# Patient Record
Sex: Male | Born: 1975
Health system: Southern US, Community
[De-identification: ages and names within clinical notes are randomized; demographics above are authoritative.]

## PROBLEM LIST (undated history)

## (undated) DIAGNOSIS — I10 Essential (primary) hypertension: Secondary | ICD-10-CM

## (undated) DIAGNOSIS — R011 Cardiac murmur, unspecified: Secondary | ICD-10-CM

---

## 1999-07-07 ENCOUNTER — Emergency Department (HOSPITAL_COMMUNITY): Admission: EM | Admit: 1999-07-07 | Discharge: 1999-07-07 | Payer: Self-pay | Admitting: Emergency Medicine

## 2002-06-01 ENCOUNTER — Emergency Department (HOSPITAL_COMMUNITY): Admission: EM | Admit: 2002-06-01 | Discharge: 2002-06-01 | Payer: Self-pay | Admitting: Internal Medicine

## 2003-03-11 ENCOUNTER — Emergency Department (HOSPITAL_COMMUNITY): Admission: EM | Admit: 2003-03-11 | Discharge: 2003-03-11 | Payer: Self-pay | Admitting: Emergency Medicine

## 2004-11-10 ENCOUNTER — Ambulatory Visit: Payer: Self-pay | Admitting: Family Medicine

## 2004-11-11 ENCOUNTER — Ambulatory Visit (HOSPITAL_COMMUNITY): Admission: RE | Admit: 2004-11-11 | Discharge: 2004-11-11 | Payer: Self-pay | Admitting: Family Medicine

## 2005-10-25 ENCOUNTER — Emergency Department (HOSPITAL_COMMUNITY): Admission: EM | Admit: 2005-10-25 | Discharge: 2005-10-25 | Payer: Self-pay | Admitting: Emergency Medicine

## 2007-04-12 ENCOUNTER — Emergency Department (HOSPITAL_COMMUNITY): Admission: EM | Admit: 2007-04-12 | Discharge: 2007-04-12 | Payer: Self-pay | Admitting: Emergency Medicine

## 2007-06-23 ENCOUNTER — Encounter: Payer: Self-pay | Admitting: Family Medicine

## 2008-07-30 ENCOUNTER — Emergency Department (HOSPITAL_COMMUNITY): Admission: EM | Admit: 2008-07-30 | Discharge: 2008-07-30 | Payer: Self-pay | Admitting: Emergency Medicine

## 2009-08-25 ENCOUNTER — Emergency Department (HOSPITAL_COMMUNITY): Admission: EM | Admit: 2009-08-25 | Discharge: 2009-08-25 | Payer: Self-pay | Admitting: Emergency Medicine

## 2010-06-09 ENCOUNTER — Emergency Department (HOSPITAL_COMMUNITY)
Admission: EM | Admit: 2010-06-09 | Discharge: 2010-06-09 | Payer: Self-pay | Source: Home / Self Care | Admitting: Emergency Medicine

## 2010-07-22 NOTE — Letter (Signed)
Summary: Historic Patient File  Historic Patient File   Imported By: Lind Guest 05/13/2010 11:14:14  _____________________________________________________________________  External Attachment:    Type:   Image     Comment:   External Document

## 2010-11-07 NOTE — Consult Note (Signed)
   NAMEKRIKOR, WILLET NO.:  [id number]   MEDICAL RECORD NO.:  [id number]                   PATIENT TYPE:  EMS   LOCATION:  ED                                   FACILITY:  [facility]   PHYSICIAN:  Barbaraann Barthel, M.D.              DATE OF BIRTH:  [birth year]   DATE OF CONSULTATION:  03/11/2003  DATE OF DISCHARGE:                                   CONSULTATION   Surgery was asked to see this 35 year old black male who presented with an  abscess on the inner aspect of his right thigh.  He is a nondiabetic.  Has  no known allergies.  He states that he has had this problem before and has  had spontaneously erupting abscesses from this area.   CLINICAL EXAMINATION:  GENERAL:  He is obese.  However, he does not have  hydradenitis suppurativa.  He does have a large formed abscess on the inner  aspect of his right thigh.  No other complaints.  VITAL SIGNS:  Temperature 98.2, blood pressure 126/74, pulse rate 93,  respirations 20.   TREATMENT RENDERED:  The area was prepped with Betadine solution and draped  in the usual manner.  Using approximately 15 mL of 1% Xylocaine without  epinephrine, the area was anesthetized.  The abscess was then opened along  its complete area of fluctuance.  This was then irrigated and packed with  normal saline-soaked 4x4 with a sterile dressing with ABD and Kerlix.  The  patient will be discharged on Cipro 500 mg p.o. b.i.d., and he was given  Darvocet-N 100 1-2 tablets p.o. q.4h. p.r.n. pain.  Arrangements are made to  follow up with him in 24 hours in my office.  He is told to go to the  emergency room should there be any acute changes in the interim.  Cultures  were taken for sensitivities.  These will be sent to my office as well.                                               Barbaraann Barthel, M.D.    WB/MEDQ  D:  03/11/2003  T:  03/12/2003  Job:  [id number]

## 2011-03-03 ENCOUNTER — Emergency Department (HOSPITAL_COMMUNITY)
Admission: EM | Admit: 2011-03-03 | Discharge: 2011-03-03 | Disposition: A | Discharge status: Home / Self Care | Payer: Self-pay | Attending: Emergency Medicine | Admitting: Emergency Medicine

## 2011-03-03 ENCOUNTER — Encounter: Payer: Self-pay | Admitting: Emergency Medicine

## 2011-03-03 DIAGNOSIS — F17200 Nicotine dependence, unspecified, uncomplicated: Secondary | ICD-10-CM | POA: Insufficient documentation

## 2011-03-03 DIAGNOSIS — I10 Essential (primary) hypertension: Secondary | ICD-10-CM | POA: Insufficient documentation

## 2011-03-03 DIAGNOSIS — S90569A Insect bite (nonvenomous), unspecified ankle, initial encounter: Secondary | ICD-10-CM | POA: Insufficient documentation

## 2011-03-03 DIAGNOSIS — W57XXXA Bitten or stung by nonvenomous insect and other nonvenomous arthropods, initial encounter: Secondary | ICD-10-CM | POA: Insufficient documentation

## 2011-03-03 HISTORY — DX: Cardiac murmur, unspecified: R01.1

## 2011-03-03 HISTORY — DX: Essential (primary) hypertension: I10

## 2011-03-03 MED ORDER — MUPIROCIN CALCIUM 2 % EX CREA: TOPICAL_CREAM | Freq: Three times a day (TID) | CUTANEOUS | Status: AC

## 2011-03-03 NOTE — ED Provider Notes (Signed)
History     CSN: [id number] Arrival date & time: 03/03/2011  8:10 AM  Chief Complaint  Patient presents with  . Abscess   HPI Travis Beltran is a 35 y.o. male who presents to the ED for insect bite to his left knee.  Onset over a week ago. Patient concerned that it may have been a spider. He has been scratching because it itches. No taking any medication for itching and not using anything on the bite. Patient denies any other problems. The history was provided by the patient.  Past Medical History  Diagnosis Date  . Hypertension   . Heart murmur     History reviewed. No pertinent past surgical history.  Family History  Problem Relation Age of Onset  . Cancer Father   . Hypertension Father   . Hypertension Mother     History  Substance Use Topics  . Smoking status: Current Everyday Smoker -- 0.5 packs/day  . Smokeless tobacco: Not on file  . Alcohol Use: No      Review of Systems  Skin:       Insect bite  All other systems reviewed and are negative.    Physical Exam  BP 167/92  Pulse 68  Temp 98.3 F (36.8 C)  Resp 20  Ht 6\' 1"  (1.854 m)  Wt 343 lb (155.584 kg)  BMI 45.25 kg/m2  SpO2 100%  Physical Exam  Nursing note and vitals reviewed. Constitutional: He is oriented to person, place, and time. He appears well-developed and well-nourished.  HENT:  Head: Normocephalic and atraumatic.  Eyes: EOM are normal.  Neck: Neck supple.  Pulmonary/Chest: Effort normal. No respiratory distress. He has no wheezes.  Abdominal: There is no tenderness.  Musculoskeletal: Normal range of motion. He exhibits no edema.  Neurological: He is alert and oriented to person, place, and time. No cranial nerve deficit.  Skin: Skin is warm and dry.       Tiny area of redness with ? Insect bite.   Psychiatric: He has a normal mood and affect.    ED Course  Procedures Patient recently started on HCTZ 25 mg. Daily for hypertension  Assessment:  Insect  bite   Hypertension  Plan:  Benadryl OTC   Bactroban cream   Follow up with PCP for blood pressure      Kerrie Buffalo, NP 03/04/11 1202

## 2011-03-03 NOTE — ED Notes (Signed)
Pt c/o "spider bite" to left knee x 16 days

## 2011-03-03 NOTE — ED Notes (Addendum)
C/O "spider bite" to left kneecap area 16 days ago.  Did not see a spider but, states he "felt it."  Very small white-like dot along mid kneecap region--no swelling, no drainage.  He reports significant pain at site and rates pain "4" on 1-10 scale.  Did not seek trt. Prior to today.

## 2011-03-03 NOTE — ED Notes (Signed)
B/P retaken in trt area  142/85---He advises he hasn't taken his B/P RX yet today--usually takes it around noon daily.

## 2011-03-04 NOTE — ED Provider Notes (Signed)
History/physical exam/procedure(s) were performed by non-physician practitioner and as supervising physician I was immediately available for consultation/collaboration. I have reviewed all notes and am in agreement with care and plan.   Hilario Quarry, MD 03/04/11 2014

## 2011-07-10 ENCOUNTER — Emergency Department (HOSPITAL_COMMUNITY)
Admission: EM | Admit: 2011-07-10 | Discharge: 2011-07-10 | Disposition: A | Discharge status: Home / Self Care | Payer: Self-pay | Attending: Emergency Medicine | Admitting: Emergency Medicine

## 2011-07-10 ENCOUNTER — Emergency Department (HOSPITAL_COMMUNITY): Payer: Self-pay

## 2011-07-10 DIAGNOSIS — I10 Essential (primary) hypertension: Secondary | ICD-10-CM | POA: Insufficient documentation

## 2011-07-10 DIAGNOSIS — M5380 Other specified dorsopathies, site unspecified: Secondary | ICD-10-CM | POA: Insufficient documentation

## 2011-07-10 DIAGNOSIS — M62830 Muscle spasm of back: Secondary | ICD-10-CM

## 2011-07-10 DIAGNOSIS — Z79899 Other long term (current) drug therapy: Secondary | ICD-10-CM | POA: Insufficient documentation

## 2011-07-10 DIAGNOSIS — J069 Acute upper respiratory infection, unspecified: Secondary | ICD-10-CM | POA: Insufficient documentation

## 2011-07-10 DIAGNOSIS — R059 Cough, unspecified: Secondary | ICD-10-CM | POA: Insufficient documentation

## 2011-07-10 DIAGNOSIS — R079 Chest pain, unspecified: Secondary | ICD-10-CM | POA: Insufficient documentation

## 2011-07-10 DIAGNOSIS — R05 Cough: Secondary | ICD-10-CM | POA: Insufficient documentation

## 2011-07-10 DIAGNOSIS — J3489 Other specified disorders of nose and nasal sinuses: Secondary | ICD-10-CM | POA: Insufficient documentation

## 2011-07-10 DIAGNOSIS — IMO0001 Reserved for inherently not codable concepts without codable children: Secondary | ICD-10-CM | POA: Insufficient documentation

## 2011-07-10 MED ORDER — CYCLOBENZAPRINE HCL 5 MG PO TABS: 5.0000 mg | ORAL_TABLET | Freq: Three times a day (TID) | ORAL | Status: AC | PRN

## 2011-07-10 NOTE — ED Notes (Signed)
Pt a/ox4. Resp even and unlabored. NAD at this time. D/C instructions reviewed with pt. Pt verbalized understanding. Pt ambulated to lobby with steady gate.  

## 2011-07-10 NOTE — ED Provider Notes (Signed)
History     CSN: [id number]  Arrival date & time 07/10/11  1636   First MD Initiated Contact with Patient 07/10/11 1655      Chief Complaint  Patient presents with  . Cough  . Nasal Congestion  . Chest Pain    (Consider location/radiation/quality/duration/timing/severity/associated sxs/prior treatment) Patient is a 36 y.o. male presenting with cough and chest pain. The history is provided by the patient.  Cough Episode onset: One week ago. The problem has not changed since onset.The cough is non-productive. There has been no fever. Associated symptoms include myalgias. Pertinent negatives include no chest pain, no headaches, no sore throat, no shortness of breath and no wheezing. Associated symptoms comments: He has complaints of intermittent upper back muscle spasms which she believes is related to his frequent coughing episodes. He denies chest pain and denies shortness of breath.. He has tried cough syrup for the symptoms. The treatment provided moderate relief. He is a smoker. His past medical history does not include pneumonia or asthma.  Chest Pain Primary symptoms include cough. Pertinent negatives for primary symptoms include no fever, no shortness of breath, no wheezing, no abdominal pain, no nausea and no dizziness.  Pertinent negatives for associated symptoms include no numbness and no weakness.     Past Medical History  Diagnosis Date  . Hypertension   . Heart murmur     No past surgical history on file.  Family History  Problem Relation Age of Onset  . Cancer Father   . Hypertension Father   . Hypertension Mother     History  Substance Use Topics  . Smoking status: Current Everyday Smoker -- 0.5 packs/day  . Smokeless tobacco: Not on file  . Alcohol Use: No      Review of Systems  Constitutional: Negative for fever.  HENT: Negative for congestion, sore throat and neck pain.   Eyes: Negative.   Respiratory: Positive for cough. Negative for chest  tightness, shortness of breath and wheezing.   Cardiovascular: Negative for chest pain.  Gastrointestinal: Negative for nausea and abdominal pain.  Genitourinary: Negative.   Musculoskeletal: Positive for myalgias. Negative for joint swelling and arthralgias.  Skin: Negative.  Negative for rash and wound.  Neurological: Negative for dizziness, weakness, light-headedness, numbness and headaches.  Hematological: Negative.   Psychiatric/Behavioral: Negative.     Allergies  Review of patient's allergies indicates no known allergies.  Home Medications   Current Outpatient Rx  Name Route Sig Dispense Refill  . HYDROCHLOROTHIAZIDE 25 MG PO TABS Oral Take 25 mg by mouth daily.     . IBUPROFEN 200 MG PO TABS Oral Take 400 mg by mouth once as needed. For aches/pain    . CYCLOBENZAPRINE HCL 5 MG PO TABS Oral Take 1 tablet (5 mg total) by mouth 3 (three) times daily as needed for muscle spasms. 20 tablet 0    BP 136/80  Pulse 88  Temp(Src) 98 F (36.7 C) (Oral)  Resp 18  Ht 6\' 1"  (1.854 m)  Wt 340 lb (154.223 kg)  BMI 44.86 kg/m2  SpO2 98%  Physical Exam  Nursing note and vitals reviewed. Constitutional: He is oriented to person, place, and time. He appears well-developed and well-nourished.  HENT:  Head: Normocephalic and atraumatic.  Eyes: Conjunctivae are normal.  Neck: Normal range of motion.  Cardiovascular: Normal rate, regular rhythm, normal heart sounds and intact distal pulses.   Pulmonary/Chest: Effort normal and breath sounds normal. He has no wheezes.  Abdominal: Soft. Bowel sounds  are normal. There is no tenderness.  Musculoskeletal: Normal range of motion.  Neurological: He is alert and oriented to person, place, and time.  Skin: Skin is warm and dry.  Psychiatric: He has a normal mood and affect.    ED Course  Procedures (including critical care time)  Labs Reviewed - No data to display Dg Chest 2 View  07/10/2011  *RADIOLOGY REPORT*  Clinical Data: Cough and  congestion  CHEST - 2 VIEW  Comparison: 04/12/2007  Findings: Upper normal heart size is stable.  Thoracic aorta and hilar contours are normal and stable.  The trachea is midline. Normal pulmonary vascularity.  The lungs are clear.  Negative for pleural effusion or pneumothorax.  No acute bony abnormality.  IMPRESSION: No acute cardiopulmonary disease.  Original Report Authenticated By: Britta Mccreedy, M.D.     1. URI (upper respiratory infection)   2. Muscle spasm of back       MDM          Candis Musa, PA 07/10/11 2304

## 2011-07-10 NOTE — ED Notes (Signed)
C/o cough/chest congestion x1 week and generalized chest pain as well. Denies n/v or sob.

## 2011-07-11 NOTE — ED Provider Notes (Signed)
Medical screening examination/treatment/procedure(s) were performed by non-physician practitioner and as supervising physician I was immediately available for consultation/collaboration.   Almeda Ezra L Fawnda Vitullo, MD 07/11/11 0003 

## 2011-09-07 ENCOUNTER — Encounter (HOSPITAL_COMMUNITY): Payer: Self-pay

## 2011-09-07 ENCOUNTER — Emergency Department (HOSPITAL_COMMUNITY)
Admission: EM | Admit: 2011-09-07 | Discharge: 2011-09-08 | Disposition: A | Discharge status: Home / Self Care | Payer: Self-pay | Attending: Emergency Medicine | Admitting: Emergency Medicine

## 2011-09-07 DIAGNOSIS — Z0389 Encounter for observation for other suspected diseases and conditions ruled out: Secondary | ICD-10-CM | POA: Insufficient documentation

## 2011-09-07 NOTE — ED Notes (Signed)
complians of back pain off and on for three weeks, sts may be realted to work.

## 2011-09-07 NOTE — ED Notes (Signed)
Unable to locate pt  

## 2011-09-07 NOTE — ED Notes (Signed)
No answer when called 

## 2011-12-21 ENCOUNTER — Emergency Department (HOSPITAL_COMMUNITY): Payer: Self-pay

## 2011-12-21 ENCOUNTER — Emergency Department (HOSPITAL_COMMUNITY)
Admission: EM | Admit: 2011-12-21 | Discharge: 2011-12-21 | Disposition: A | Discharge status: Home / Self Care | Payer: Self-pay | Attending: Emergency Medicine | Admitting: Emergency Medicine

## 2011-12-21 ENCOUNTER — Encounter (HOSPITAL_COMMUNITY): Payer: Self-pay | Admitting: *Deleted

## 2011-12-21 DIAGNOSIS — M7989 Other specified soft tissue disorders: Secondary | ICD-10-CM | POA: Insufficient documentation

## 2011-12-21 DIAGNOSIS — R600 Localized edema: Secondary | ICD-10-CM

## 2011-12-21 DIAGNOSIS — I10 Essential (primary) hypertension: Secondary | ICD-10-CM | POA: Insufficient documentation

## 2011-12-21 DIAGNOSIS — Z79899 Other long term (current) drug therapy: Secondary | ICD-10-CM | POA: Insufficient documentation

## 2011-12-21 DIAGNOSIS — R609 Edema, unspecified: Secondary | ICD-10-CM | POA: Insufficient documentation

## 2011-12-21 DIAGNOSIS — F17200 Nicotine dependence, unspecified, uncomplicated: Secondary | ICD-10-CM | POA: Insufficient documentation

## 2011-12-21 LAB — COMPREHENSIVE METABOLIC PANEL
ALT: 19 U/L (ref 0–53)
AST: 21 U/L (ref 0–37)
Albumin: 4 g/dL (ref 3.5–5.2)
Alkaline Phosphatase: 103 U/L (ref 39–117)
BUN: 11 mg/dL (ref 6–23)
CO2: 27 mEq/L (ref 19–32)
Calcium: 10.2 mg/dL (ref 8.4–10.5)
Chloride: 99 mEq/L (ref 96–112)
Creatinine, Ser: 0.94 mg/dL (ref 0.50–1.35)
GFR calc Af Amer: >90 mL/min (ref >90)
GFR calc non Af Amer: >90 mL/min (ref >90)
Glucose, Bld: 95 mg/dL (ref 70–99)
Potassium: 4.1 mEq/L (ref 3.5–5.1)
Sodium: 137 mEq/L (ref 135–145)
Total Bilirubin: 0.3 mg/dL (ref 0.3–1.2)
Total Protein: 7.4 g/dL (ref 6.0–8.3)

## 2011-12-21 LAB — CBC WITH DIFFERENTIAL/PLATELET
Basophils Absolute: 0.1 10*3/uL (ref 0.0–0.1)
Basophils Relative: 1 % (ref 0–1)
Eosinophils Absolute: 0.4 10*3/uL (ref 0.0–0.7)
Eosinophils Relative: 5 % (ref 0–5)
HCT: 49.9 % (ref 39.0–52.0)
Hemoglobin: 17.1 g/dL — ABNORMAL HIGH (ref 13.0–17.0)
Lymphocytes Relative: 30 % (ref 12–46)
Lymphs Abs: 2.8 10*3/uL (ref 0.7–4.0)
MCH: 29.6 pg (ref 26.0–34.0)
MCHC: 34.3 g/dL (ref 30.0–36.0)
MCV: 86.3 fL (ref 78.0–100.0)
Monocytes Absolute: 0.6 10*3/uL (ref 0.1–1.0)
Monocytes Relative: 6 % (ref 3–12)
Neutro Abs: 5.2 10*3/uL (ref 1.7–7.7)
Neutrophils Relative %: 58 % (ref 43–77)
Platelets: 243 10*3/uL (ref 150–400)
RBC: 5.78 MIL/uL (ref 4.22–5.81)
RDW: 13.8 % (ref 11.5–15.5)
WBC: 9.1 10*3/uL (ref 4.0–10.5)

## 2011-12-21 LAB — PRO B NATRIURETIC PEPTIDE: Pro B Natriuretic peptide (BNP): 5 pg/mL (ref 0–125)

## 2011-12-21 MED ORDER — FUROSEMIDE 40 MG PO TABS: 40.0000 mg | ORAL_TABLET | Freq: Every day | ORAL | Status: DC

## 2011-12-21 NOTE — ED Notes (Signed)
Swelling bil feet without pain.  X 2 weeks.

## 2011-12-21 NOTE — ED Provider Notes (Signed)
History     CSN: [id number]  Arrival date & time 12/21/11  1256   First MD Initiated Contact with Patient 12/21/11 1311      Chief Complaint  Patient presents with  . Foot Swelling    (Consider location/radiation/quality/duration/timing/severity/associated sxs/prior treatment) HPI Comments: Patient with swelling of the feet for the past several weeks.  He is on his feet a lot at work and seems to improve with elevation at the end of the day.  Now worsening.  No chest pain or shortness or breath.  No fevers.  Urinating normally.  Has history of obesity and hypertension and takes hctz which he was off for a while and has now restarted.    The history is provided by the patient.    Past Medical History  Diagnosis Date  . Hypertension   . Heart murmur     History reviewed. No pertinent past surgical history.  Family History  Problem Relation Age of Onset  . Cancer Father   . Hypertension Father   . Hypertension Mother     History  Substance Use Topics  . Smoking status: Current Everyday Smoker -- 0.5 packs/day  . Smokeless tobacco: Not on file  . Alcohol Use: No      Review of Systems  Respiratory: Negative for shortness of breath.   Cardiovascular: Positive for leg swelling. Negative for chest pain.  All other systems reviewed and are negative.    Allergies  Review of patient's allergies indicates no known allergies.  Home Medications   Current Outpatient Rx  Name Route Sig Dispense Refill  . CYCLOBENZAPRINE HCL 5 MG PO TABS Oral Take 5 mg by mouth 3 (three) times daily as needed. For muscle spasms    . HYDROCHLOROTHIAZIDE 25 MG PO TABS Oral Take 25 mg by mouth daily.       BP 155/94  Pulse 94  Temp 98.3 F (36.8 C) (Oral)  Resp 20  Ht 6\' 1"  (1.854 m)  Wt 352 lb (159.666 kg)  BMI 46.44 kg/m2  SpO2 100%  Physical Exam  Nursing note and vitals reviewed. Constitutional: He is oriented to person, place, and time. He appears well-developed and  well-nourished. No distress.  HENT:  Head: Normocephalic and atraumatic.  Mouth/Throat: Oropharynx is clear and moist.  Neck: Normal range of motion. Neck supple.  Cardiovascular: Normal rate and regular rhythm.   No murmur heard. Pulmonary/Chest: Effort normal and breath sounds normal. No respiratory distress. He has no rales.  Abdominal: Soft. Bowel sounds are normal. He exhibits no distension. There is no tenderness.  Musculoskeletal:       There is 1-2+ ble pitting edema.  Pulses are strong in the DP and PT bilaterally.  Neurological: He is alert and oriented to person, place, and time. No cranial nerve deficit. Coordination normal.  Skin: Skin is warm and dry. He is not diaphoretic.    ED Course  Procedures (including critical care time)   Labs Reviewed  CBC WITH DIFFERENTIAL  COMPREHENSIVE METABOLIC PANEL  PRO B NATRIURETIC PEPTIDE   No results found.   No diagnosis found.   Date: 12/21/2011  Rate: 86  Rhythm: normal sinus rhythm  QRS Axis: normal  Intervals: normal  ST/T Wave abnormalities: normal  Conduction Disutrbances:none  Narrative Interpretation:   Old EKG Reviewed: none available    MDM  The labs, ekg, and chest xray all appear unremarkable.  I suspect this is more related to dependent edema than chf.  He will be given  lasix, elevation of the legs, close follow up with pcp.          Geoffery Lyons, MD 12/21/11 [phone]

## 2011-12-21 NOTE — Discharge Instructions (Signed)
Peripheral Edema You have swelling in your legs (peripheral edema). This swelling is due to excess accumulation of salt and water in your body. Edema may be a sign of heart, kidney or liver disease, or a side effect of a medication. It may also be due to problems in the leg veins. Elevating your legs and using special support stockings may be very helpful, if the cause of the swelling is due to poor venous circulation. Avoid long periods of standing, whatever the cause. Treatment of edema depends on identifying the cause. Chips, pretzels, pickles and other salty foods should be avoided. Restricting salt in your diet is almost always needed. Water pills (diuretics) are often used to remove the excess salt and water from your body via urine. These medicines prevent the kidney from reabsorbing sodium. This increases urine flow. Diuretic treatment may also result in lowering of potassium levels in your body. Potassium supplements may be needed if you have to use diuretics daily. Daily weights can help you keep track of your progress in clearing your edema. You should call your caregiver for follow up care as recommended. SEEK IMMEDIATE MEDICAL CARE IF:   You have increased swelling, pain, redness, or heat in your legs.   You develop shortness of breath, especially when lying down.   You develop chest or abdominal pain, weakness, or fainting.   You have a fever.  Document Released: 07/16/2004 Document Revised: 05/28/2011 Document Reviewed: 06/26/2009 Exit[employer] Patient Information 2012 [employer]. 

## 2012-06-08 ENCOUNTER — Emergency Department (HOSPITAL_COMMUNITY)
Admission: EM | Admit: 2012-06-08 | Discharge: 2012-06-08 | Disposition: A | Discharge status: Home / Self Care | Payer: Self-pay | Attending: Emergency Medicine | Admitting: Emergency Medicine

## 2012-06-08 ENCOUNTER — Encounter (HOSPITAL_COMMUNITY): Payer: Self-pay | Admitting: Emergency Medicine

## 2012-06-08 DIAGNOSIS — F17200 Nicotine dependence, unspecified, uncomplicated: Secondary | ICD-10-CM | POA: Insufficient documentation

## 2012-06-08 DIAGNOSIS — H9209 Otalgia, unspecified ear: Secondary | ICD-10-CM | POA: Insufficient documentation

## 2012-06-08 DIAGNOSIS — Z8679 Personal history of other diseases of the circulatory system: Secondary | ICD-10-CM | POA: Insufficient documentation

## 2012-06-08 DIAGNOSIS — Z79899 Other long term (current) drug therapy: Secondary | ICD-10-CM | POA: Insufficient documentation

## 2012-06-08 DIAGNOSIS — K0889 Other specified disorders of teeth and supporting structures: Secondary | ICD-10-CM

## 2012-06-08 DIAGNOSIS — I10 Essential (primary) hypertension: Secondary | ICD-10-CM | POA: Insufficient documentation

## 2012-06-08 DIAGNOSIS — K089 Disorder of teeth and supporting structures, unspecified: Secondary | ICD-10-CM | POA: Insufficient documentation

## 2012-06-08 DIAGNOSIS — K029 Dental caries, unspecified: Secondary | ICD-10-CM | POA: Insufficient documentation

## 2012-06-08 MED ORDER — HYDROCODONE-ACETAMINOPHEN 5-325 MG PO TABS: ORAL_TABLET | ORAL | Status: DC

## 2012-06-08 MED ORDER — PENICILLIN V POTASSIUM 500 MG PO TABS: 500.0000 mg | ORAL_TABLET | Freq: Four times a day (QID) | ORAL | Status: AC

## 2012-06-08 NOTE — ED Notes (Signed)
Pt c/o dental pain x 2 weeks.  

## 2012-06-08 NOTE — ED Provider Notes (Signed)
Medical screening examination/treatment/procedure(s) were performed by non-physician practitioner and as supervising physician I was immediately available for consultation/collaboration.  Leimomi Zervas, MD 06/08/12 1526 

## 2012-06-08 NOTE — ED Provider Notes (Signed)
History     CSN: [id number]  Arrival date & time 06/08/12  1115   First MD Initiated Contact with Patient 06/08/12 1124      Chief Complaint  Patient presents with  . Dental Pain    (Consider location/radiation/quality/duration/timing/severity/associated sxs/prior treatment) Patient is a 36 y.o. male presenting with tooth pain. The history is provided by the patient.  Dental PainThe primary symptoms include mouth pain. Primary symptoms do not include dental injury, oral bleeding, oral lesions, headaches, fever, shortness of breath, sore throat, angioedema or cough. Episode onset: 2 weeks ago. The symptoms are worsening. The symptoms are new. The symptoms occur constantly.  Additional symptoms include: dental sensitivity to temperature, gum swelling, gum tenderness and ear pain. Additional symptoms do not include: purulent gums, trismus, jaw pain, facial swelling, trouble swallowing, pain with swallowing, drooling and swollen glands. Medical issues include: smoking and periodontal disease.    Past Medical History  Diagnosis Date  . Hypertension   . Heart murmur     History reviewed. No pertinent past surgical history.  Family History  Problem Relation Age of Onset  . Cancer Father   . Hypertension Father   . Hypertension Mother     History  Substance Use Topics  . Smoking status: Current Every Day Smoker -- 0.5 packs/day  . Smokeless tobacco: Not on file  . Alcohol Use: No      Review of Systems  Constitutional: Negative for fever and appetite change.  HENT: Positive for ear pain and dental problem. Negative for congestion, sore throat, facial swelling, drooling, trouble swallowing, neck pain and neck stiffness.   Eyes: Negative for pain and visual disturbance.  Respiratory: Negative for cough and shortness of breath.   Neurological: Negative for dizziness, facial asymmetry and headaches.  Hematological: Negative for adenopathy.  All other systems reviewed and are  negative.    Allergies  Review of patient's allergies indicates no known allergies.  Home Medications   Current Outpatient Rx  Name  Route  Sig  Dispense  Refill  . FUROSEMIDE 40 MG PO TABS   Oral   Take 1 tablet (40 mg total) by mouth daily.   30 tablet   0   . HYDROCHLOROTHIAZIDE 25 MG PO TABS   Oral   Take 25 mg by mouth every morning.            BP 133/84  Pulse 85  Temp 98 F (36.7 C) (Oral)  Resp 20  Ht 6\' 1"  (1.854 m)  Wt 346 lb (156.945 kg)  BMI 45.65 kg/m2  SpO2 98%  Physical Exam  Nursing note and vitals reviewed. Constitutional: He is oriented to person, place, and time. He appears well-developed and well-nourished. No distress.  HENT:  Head: Normocephalic and atraumatic. No trismus in the jaw.  Right Ear: Tympanic membrane and ear canal normal.  Left Ear: Tympanic membrane and ear canal normal.  Mouth/Throat: Uvula is midline, oropharynx is clear and moist and mucous membranes are normal. Dental caries present. No dental abscesses or uvula swelling.         Dental decay of the left lower third molar. Mild edema and erythema of the surrounding gums. No dental abscess, facial edema or trismus.  Neck: Normal range of motion. Neck supple.  Cardiovascular: Normal rate, regular rhythm, normal heart sounds and intact distal pulses.   No murmur heard. Pulmonary/Chest: Effort normal and breath sounds normal.  Musculoskeletal: Normal range of motion.  Lymphadenopathy:    He has no cervical  adenopathy.  Neurological: He is alert and oriented to person, place, and time. He exhibits normal muscle tone. Coordination normal.  Skin: Skin is warm and dry.    ED Course  Procedures (including critical care time)  Labs Reviewed - No data to display No results found.     MDM    Dental decay of the left lower third molar.  No facial edema or trismus.  Pt has appt with dentist on 06/28/11    Prescribed: Pen VK Norco #20  Joson Sapp L. Hence Derrick,  [place] 06/08/12 1135

## 2012-06-08 NOTE — ED Notes (Signed)
Patient with no complaints at this time. Respirations even and unlabored. Skin warm/dry. Discharge instructions reviewed with patient at this time. Patient given opportunity to voice concerns/ask questions. Patient discharged at this time and left Emergency Department with steady gait.   

## 2014-04-18 ENCOUNTER — Emergency Department (HOSPITAL_COMMUNITY)
Admission: EM | Admit: 2014-04-18 | Discharge: 2014-04-18 | Disposition: A | Discharge status: Home / Self Care | Payer: Self-pay | Attending: Emergency Medicine | Admitting: Emergency Medicine

## 2014-04-18 ENCOUNTER — Encounter (HOSPITAL_COMMUNITY): Payer: Self-pay | Admitting: Emergency Medicine

## 2014-04-18 DIAGNOSIS — I10 Essential (primary) hypertension: Secondary | ICD-10-CM | POA: Insufficient documentation

## 2014-04-18 DIAGNOSIS — L02416 Cutaneous abscess of left lower limb: Secondary | ICD-10-CM | POA: Insufficient documentation

## 2014-04-18 DIAGNOSIS — Z720 Tobacco use: Secondary | ICD-10-CM | POA: Insufficient documentation

## 2014-04-18 DIAGNOSIS — Z79899 Other long term (current) drug therapy: Secondary | ICD-10-CM | POA: Insufficient documentation

## 2014-04-18 DIAGNOSIS — R011 Cardiac murmur, unspecified: Secondary | ICD-10-CM | POA: Insufficient documentation

## 2014-04-18 MED ORDER — BACITRACIN 500 UNIT/GM EX OINT
1.0000 "application " | TOPICAL_OINTMENT | Freq: Two times a day (BID) | CUTANEOUS | Status: DC
  Administered 2014-04-18: 1 via TOPICAL

## 2014-04-18 MED ORDER — OXYCODONE-ACETAMINOPHEN 5-325 MG PO TABS
1.0000 | ORAL_TABLET | Freq: Once | ORAL | Status: AC
  Administered 2014-04-18: 1 via ORAL
  Filled 2014-04-18: qty 1

## 2014-04-18 MED ORDER — BACITRACIN ZINC 500 UNIT/GM EX OINT
TOPICAL_OINTMENT | CUTANEOUS | Status: AC
  Administered 2014-04-18: 1 via TOPICAL
  Filled 2014-04-18: qty 0.9

## 2014-04-18 MED ORDER — SULFAMETHOXAZOLE-TRIMETHOPRIM 800-160 MG PO TABS: 1.0000 | ORAL_TABLET | Freq: Two times a day (BID) | ORAL | Status: DC

## 2014-04-18 MED ORDER — LIDOCAINE HCL (PF) 2 % IJ SOLN
INTRAMUSCULAR | Status: AC
  Administered 2014-04-18: 20:00:00
  Filled 2014-04-18: qty 10

## 2014-04-18 MED ORDER — OXYCODONE-ACETAMINOPHEN 5-325 MG PO TABS: 1.0000 | ORAL_TABLET | ORAL | Status: DC | PRN

## 2014-04-18 MED ORDER — SULFAMETHOXAZOLE-TMP DS 800-160 MG PO TABS
1.0000 | ORAL_TABLET | Freq: Once | ORAL | Status: AC
  Administered 2014-04-18: 1 via ORAL
  Filled 2014-04-18: qty 1

## 2014-04-18 NOTE — Discharge Instructions (Signed)
Abscess °An abscess (boil or furuncle) is an infected area on or under the skin. This area is filled with yellowish-white fluid (pus) and other material (debris). °HOME CARE  °· Only take medicines as told by your doctor. °· If you were given antibiotic medicine, take it as directed. Finish the medicine even if you start to feel better. °· If gauze is used, follow your doctor's directions for changing the gauze. °· To avoid spreading the infection: °¨ Keep your abscess covered with a bandage. °¨ Wash your hands well. °¨ Do not share personal care items, towels, or whirlpools with others. °¨ Avoid skin contact with others. °· Keep your skin and clothes clean around the abscess. °· Keep all doctor visits as told. °GET HELP RIGHT AWAY IF:  °· You have more pain, puffiness (swelling), or redness in the wound site. °· You have more fluid or blood coming from the wound site. °· You have muscle aches, chills, or you feel sick. °· You have a fever. °MAKE SURE YOU:  °· Understand these instructions. °· Will watch your condition. °· Will get help right away if you are not doing well or get worse. °Document Released: 11/25/2007 Document Revised: 12/08/2011 Document Reviewed: 08/21/2011 °Exit[employer]® Patient Information ©2015 [employer]. This information is not intended to replace advice given to you by your health care provider. Make sure you discuss any questions you have with your health care provider. ° °

## 2014-04-18 NOTE — ED Notes (Signed)
Abscess groin region. With drainage

## 2014-04-18 NOTE — ED Provider Notes (Signed)
CSN: [id number]     Arrival date & time 04/18/14  1733 History   First MD Initiated Contact with Patient 04/18/14 1842     Chief Complaint  Patient presents with  . Abscess     (Consider location/radiation/quality/duration/timing/severity/associated sxs/prior Treatment) HPI  Travis Beltran is a 38 y.o. male who presents to the Emergency Department complaining of pain, redness to the left inner thigh for several days.  He states that he has h/o of boils and this pain feels similar to previous.  He also reports mild drainage and intermittent chills that began earlier today.  He tries any therapies.  He also denies fever, vomiting , difficulty urinating, or abdominal pain.    Past Medical History  Diagnosis Date  . Hypertension   . Heart murmur    History reviewed. No pertinent past surgical history. Family History  Problem Relation Age of Onset  . Cancer Father   . Hypertension Father   . Hypertension Mother    History  Substance Use Topics  . Smoking status: Current Every Day Smoker -- 0.50 packs/day  . Smokeless tobacco: Not on file  . Alcohol Use: No    Review of Systems  Constitutional: Negative for fever and chills.  Gastrointestinal: Negative for nausea and vomiting.  Musculoskeletal: Negative for arthralgias and joint swelling.  Skin: Positive for color change.       Abscess   Hematological: Negative for adenopathy.  All other systems reviewed and are negative.     Allergies  Review of patient's allergies indicates no known allergies.  Home Medications   Prior to Admission medications   Medication Sig Start Date End Date Taking? Authorizing Provider  furosemide (LASIX) 40 MG tablet Take 1 tablet (40 mg total) by mouth daily. 12/21/11 12/20/12  Travis Lyonsouglas Delo, MD  hydrochlorothiazide 25 MG tablet Take 25 mg by mouth every morning.     Historical Provider, MD  HYDROcodone-acetaminophen (NORCO/VICODIN) 5-325 MG per tablet Take one-two tabs po q 4-6 hrs prn pain  06/08/12   Marliss Buttacavoli L. Dulcy Sida, PA-C   BP 154/94  Pulse 98  Temp(Src) 99 F (37.2 C) (Oral)  Resp 21  Ht 6\' 1"  (1.854 m)  Wt 343 lb (155.584 kg)  BMI 45.26 kg/m2  SpO2 99% Physical Exam  Nursing note and vitals reviewed. Constitutional: He is oriented to person, place, and time. He appears well-developed and well-nourished. No distress.  HENT:  Head: Normocephalic and atraumatic.  Cardiovascular: Normal rate, regular rhythm and normal heart sounds.   No murmur heard. Pulmonary/Chest: Effort normal and breath sounds normal. No respiratory distress.  Abdominal: Soft. He exhibits no distension. There is no tenderness. There is no rebound and no guarding.  Musculoskeletal: Normal range of motion.  Neurological: He is alert and oriented to person, place, and time. He exhibits normal muscle tone. Coordination normal.  Skin: Skin is warm and dry. There is erythema.  5 cm localized area of fluctuance and induration to the left inner thigh.  Moderate surrounding erythema.  Scant purulent drainage noted.      ED Course  Procedures (including critical care time) Labs Review Labs Reviewed - No data to display  Imaging Review No results found.   EKG Interpretation None       INCISION AND DRAINAGE Performed by: Maxwell CaulRIPLETT,Christpoher Sievers L. Consent: Verbal consent obtained. Risks and benefits: risks, benefits and alternatives were discussed Type: abscess  Body area: let inner thigh Anesthesia: local infiltration  Incision was made with a #11 scalpel.  Local  anesthetic: lidocaine 2% w/o epinephrine  Anesthetic total: 3 ml  Complexity: complex Blunt dissection to break up loculations  Drainage: purulent  Drainage amount: large  Packing material: 1/4 in iodoform gauze  Patient tolerance: Patient tolerated the procedure well with no immediate complications.    MDM   Final diagnoses:  Abscess of left thigh   Pt is well appearing.  Non-toxic.  Ambulates with a steady gait.  Pt  has h/o previous abscesses and agrees to warm compresses or soaks, rx for bactrim and percocet.  He also agrees to remove the packing in 2 days and to return here if needed.       Shed Nixon L. Ritaj Dullea, PA-C 04/19/14 1620

## 2014-04-19 NOTE — ED Provider Notes (Signed)
Medical screening examination/treatment/procedure(s) were performed by non-physician practitioner and as supervising physician I was immediately available for consultation/collaboration.   EKG Interpretation None        Jasir Rother L Fredrico Beedle, MD 04/19/14 1705 

## 2015-01-21 ENCOUNTER — Emergency Department (HOSPITAL_COMMUNITY)
Admission: EM | Admit: 2015-01-21 | Discharge: 2015-01-21 | Disposition: A | Discharge status: Home / Self Care | Payer: Self-pay | Attending: Emergency Medicine | Admitting: Emergency Medicine

## 2015-01-21 ENCOUNTER — Encounter (HOSPITAL_COMMUNITY): Payer: Self-pay | Admitting: Emergency Medicine

## 2015-01-21 ENCOUNTER — Emergency Department (HOSPITAL_COMMUNITY): Payer: Self-pay

## 2015-01-21 DIAGNOSIS — I10 Essential (primary) hypertension: Secondary | ICD-10-CM | POA: Insufficient documentation

## 2015-01-21 DIAGNOSIS — Z720 Tobacco use: Secondary | ICD-10-CM | POA: Insufficient documentation

## 2015-01-21 DIAGNOSIS — R011 Cardiac murmur, unspecified: Secondary | ICD-10-CM | POA: Insufficient documentation

## 2015-01-21 DIAGNOSIS — J329 Chronic sinusitis, unspecified: Secondary | ICD-10-CM

## 2015-01-21 DIAGNOSIS — Z79899 Other long term (current) drug therapy: Secondary | ICD-10-CM | POA: Insufficient documentation

## 2015-01-21 DIAGNOSIS — J019 Acute sinusitis, unspecified: Secondary | ICD-10-CM | POA: Insufficient documentation

## 2015-01-21 DIAGNOSIS — J069 Acute upper respiratory infection, unspecified: Secondary | ICD-10-CM | POA: Insufficient documentation

## 2015-01-21 MED ORDER — LORATADINE-PSEUDOEPHEDRINE ER 5-120 MG PO TB12: 1.0000 | ORAL_TABLET | Freq: Two times a day (BID) | ORAL | Status: DC

## 2015-01-21 MED ORDER — HYDROCODONE-HOMATROPINE 5-1.5 MG/5ML PO SYRP: 5.0000 mL | ORAL_SOLUTION | Freq: Four times a day (QID) | ORAL | Status: DC | PRN

## 2015-01-21 NOTE — ED Notes (Signed)
Patient given discharge instruction, verbalized understand. Patient ambulatory out of the department.  

## 2015-01-21 NOTE — ED Provider Notes (Signed)
CSN: [id number]     Arrival date & time 01/21/15  1042 History  This chart was scribed for non-physician practitioner, Ivery Quale, PA-C working with Rolland Porter, MD by Placido Sou, ED scribe. This patient was seen in room [facility] and the patient's care was started at 11:32 AM.  Chief Complaint  Patient presents with  . Sinusitis   The history is provided by the patient. No language interpreter was used.    HPI Comments: Travis Beltran is a 39 y.o. male, with a hx of smoking, who presents to the Emergency Department complaining of constant, mild, congestion with onset 3 days ago. Pt notes that last Friday after leaving work that he felt the onset of his symptoms. He notes associated rhinorrhea, diaphoresis, cough and an unconfirmed fever. Pt denies any hx of DM, transplants or an autoimmune deficiency. Pt denies coughing up blood, nausea or vomiting.   Past Medical History  Diagnosis Date  . Hypertension   . Heart murmur    History reviewed. No pertinent past surgical history. Family History  Problem Relation Age of Onset  . Cancer Father   . Hypertension Father   . Hypertension Mother    History  Substance Use Topics  . Smoking status: Current Every Day Smoker -- 0.50 packs/day  . Smokeless tobacco: Not on file  . Alcohol Use: No    Review of Systems  Constitutional: Positive for diaphoresis.  HENT: Positive for congestion, rhinorrhea, sinus pressure and voice change.   Respiratory: Positive for cough.   Allergic/Immunologic: Negative for immunocompromised state.  All other systems reviewed and are negative.  Allergies  Review of patient's allergies indicates no known allergies.  Home Medications   Prior to Admission medications   Medication Sig Start Date End Date Taking? Authorizing Provider  furosemide (LASIX) 40 MG tablet Take 1 tablet (40 mg total) by mouth daily. 12/21/11 12/20/12  Geoffery Lyons, MD  hydrochlorothiazide 25 MG tablet Take 25 mg by mouth  every morning.     Historical Provider, MD  HYDROcodone-acetaminophen (NORCO/VICODIN) 5-325 MG per tablet Take one-two tabs po q 4-6 hrs prn pain 06/08/12   Tammy Triplett, PA-C  oxyCODONE-acetaminophen (PERCOCET/ROXICET) 5-325 MG per tablet Take 1 tablet by mouth every 4 (four) hours as needed. 04/18/14   Tammy Triplett, PA-C  sulfamethoxazole-trimethoprim (SEPTRA DS) 800-160 MG per tablet Take 1 tablet by mouth 2 (two) times daily. For 10 days 04/18/14   Tammy Triplett, PA-C   BP 140/84 mmHg  Pulse 87  Temp(Src) 98 F (36.7 C) (Oral)  Resp 19  Ht 6\' 1"  (1.854 m)  Wt 363 lb (164.656 kg)  BMI 47.90 kg/m2  SpO2 97% Physical Exam  Constitutional: He is oriented to person, place, and time. He appears well-developed and well-nourished. No distress.  HENT:  Head: Normocephalic and atraumatic.  Right Ear: External ear normal.  Left Ear: External ear normal.  Mouth/Throat: Oropharynx is clear and moist.  Uvula enlarged but inline; mild increased redness of posterior pharynx; nasal congestion present  Eyes: Conjunctivae and EOM are normal. Pupils are equal, round, and reactive to light.  Neck: Normal range of motion. Neck supple. No tracheal deviation present.  Cardiovascular: Normal rate, regular rhythm and normal heart sounds.   Pulmonary/Chest: Effort normal. No respiratory distress. He has wheezes.  Symmetrical rise of chest; lungs clear with wheezing  Abdominal: Soft.  Musculoskeletal: Normal range of motion.  Neurological: He is alert and oriented to person, place, and time.  Skin: Skin is warm and  dry.  Psychiatric: He has a normal mood and affect. His behavior is normal.  Nursing note and vitals reviewed.  ED Course  Procedures  DIAGNOSTIC STUDIES: Oxygen Saturation is 97% on RA, normal by my interpretation.    COORDINATION OF CARE: 11:38 AM Discussed treatment plan with pt at bedside including Claritin D, ibuprofen, increased fluids and advised to stop smoking. Pt agreed to  plan.  Labs Review Labs Reviewed - No data to display  Imaging Review No results found.   EKG Interpretation None      MDM  Vital signs are well within normal limits. The examination favors upper respiratory infection and sinusitis. The patient will be treated with Claritin-D, increase fluids, ibuprofen, and Hycodan cough medication. A work note has been given to the patient.    Final diagnoses:  None    *I have reviewed nursing notes, vital signs, and all appropriate lab and imaging results for this patient.**  **I personally performed the services described in this documentation, which was scribed in my presence. The recorded information has been reviewed and is accurate.Ivery Quale, PA-C 01/21/15 1222  Rolland Porter, MD 01/29/15 [phone]

## 2015-01-21 NOTE — Discharge Instructions (Signed)
Upper Respiratory Infection, Adult An upper respiratory infection (URI) is also sometimes known as the common cold. The upper respiratory tract includes the nose, sinuses, throat, trachea, and bronchi. Bronchi are the airways leading to the lungs. Most people improve within 1 week, but symptoms can last up to 2 weeks. A residual cough may last even longer.  CAUSES Many different viruses can infect the tissues lining the upper respiratory tract. The tissues become irritated and inflamed and often become very moist. Mucus production is also common. A cold is contagious. You can easily spread the virus to others by oral contact. This includes kissing, sharing a glass, coughing, or sneezing. Touching your mouth or nose and then touching a surface, which is then touched by another person, can also spread the virus. SYMPTOMS  Symptoms typically develop 1 to 3 days after you come in contact with a cold virus. Symptoms vary from person to person. They may include:  Runny nose.  Sneezing.  Nasal congestion.  Sinus irritation.  Sore throat.  Loss of voice (laryngitis).  Cough.  Fatigue.  Muscle aches.  Loss of appetite.  Headache.  Low-grade fever. DIAGNOSIS  You might diagnose your own cold based on familiar symptoms, since most people get a cold 2 to 3 times a year. Your caregiver can confirm this based on your exam. Most importantly, your caregiver can check that your symptoms are not due to another disease such as strep throat, sinusitis, pneumonia, asthma, or epiglottitis. Blood tests, throat tests, and X-rays are not necessary to diagnose a common cold, but they may sometimes be helpful in excluding other more serious diseases. Your caregiver will decide if any further tests are required. RISKS AND COMPLICATIONS  You may be at risk for a more severe case of the common cold if you smoke cigarettes, have chronic heart disease (such as heart failure) or lung disease (such as asthma), or if  you have a weakened immune system. The very young and very old are also at risk for more serious infections. Bacterial sinusitis, middle ear infections, and bacterial pneumonia can complicate the common cold. The common cold can worsen asthma and chronic obstructive pulmonary disease (COPD). Sometimes, these complications can require emergency medical care and may be life-threatening. PREVENTION  The best way to protect against getting a cold is to practice good hygiene. Avoid oral or hand contact with people with cold symptoms. Wash your hands often if contact occurs. There is no clear evidence that vitamin C, vitamin E, echinacea, or exercise reduces the chance of developing a cold. However, it is always recommended to get plenty of rest and practice good nutrition. TREATMENT  Treatment is directed at relieving symptoms. There is no cure. Antibiotics are not effective, because the infection is caused by a virus, not by bacteria. Treatment may include:  Increased fluid intake. Sports drinks offer valuable electrolytes, sugars, and fluids.  Breathing heated mist or steam (vaporizer or shower).  Eating chicken soup or other clear broths, and maintaining good nutrition.  Getting plenty of rest.  Using gargles or lozenges for comfort.  Controlling fevers with ibuprofen or acetaminophen as directed by your caregiver.  Increasing usage of your inhaler if you have asthma. Zinc gel and zinc lozenges, taken in the first 24 hours of the common cold, can shorten the duration and lessen the severity of symptoms. Pain medicines may help with fever, muscle aches, and throat pain. A variety of non-prescription medicines are available to treat congestion and runny nose. Your caregiver   can make recommendations and may suggest nasal or lung inhalers for other symptoms.  HOME CARE INSTRUCTIONS   Only take over-the-counter or prescription medicines for pain, discomfort, or fever as directed by your  caregiver.  Use a warm mist humidifier or inhale steam from a shower to increase air moisture. This may keep secretions moist and make it easier to breathe.  Drink enough water and fluids to keep your urine clear or pale yellow.  Rest as needed.  Return to work when your temperature has returned to normal or as your caregiver advises. You may need to stay home longer to avoid infecting others. You can also use a face mask and careful hand washing to prevent spread of the virus. SEEK MEDICAL CARE IF:   After the first few days, you feel you are getting worse rather than better.  You need your caregiver's advice about medicines to control symptoms.  You develop chills, worsening shortness of breath, or brown or red sputum. These may be signs of pneumonia.  You develop yellow or brown nasal discharge or pain in the face, especially when you bend forward. These may be signs of sinusitis.  You develop a fever, swollen neck glands, pain with swallowing, or white areas in the back of your throat. These may be signs of strep throat. SEEK IMMEDIATE MEDICAL CARE IF:   You have a fever.  You develop severe or persistent headache, ear pain, sinus pain, or chest pain.  You develop wheezing, a prolonged cough, cough up blood, or have a change in your usual mucus (if you have chronic lung disease).  You develop sore muscles or a stiff neck. Document Released: 12/02/2000 Document Revised: 08/31/2011 Document Reviewed: 09/13/2013 [employer] Patient Information 2015 [employer]. This information is not intended to replace advice given to you by your health care provider. Make sure you discuss any questions you have with your health care provider.  

## 2015-01-21 NOTE — ED Notes (Signed)
Patient with c/o sinus congestion and cough x 2 weeks.

## 2015-06-18 ENCOUNTER — Emergency Department (HOSPITAL_COMMUNITY)
Admission: EM | Admit: 2015-06-18 | Discharge: 2015-06-18 | Disposition: A | Discharge status: Home / Self Care | Payer: Self-pay | Attending: Emergency Medicine | Admitting: Emergency Medicine

## 2015-06-18 ENCOUNTER — Emergency Department (HOSPITAL_COMMUNITY): Payer: Self-pay

## 2015-06-18 ENCOUNTER — Encounter (HOSPITAL_COMMUNITY): Payer: Self-pay | Admitting: *Deleted

## 2015-06-18 DIAGNOSIS — I10 Essential (primary) hypertension: Secondary | ICD-10-CM | POA: Insufficient documentation

## 2015-06-18 DIAGNOSIS — F17210 Nicotine dependence, cigarettes, uncomplicated: Secondary | ICD-10-CM | POA: Insufficient documentation

## 2015-06-18 DIAGNOSIS — R42 Dizziness and giddiness: Secondary | ICD-10-CM | POA: Insufficient documentation

## 2015-06-18 DIAGNOSIS — Z79899 Other long term (current) drug therapy: Secondary | ICD-10-CM | POA: Insufficient documentation

## 2015-06-18 DIAGNOSIS — R011 Cardiac murmur, unspecified: Secondary | ICD-10-CM | POA: Insufficient documentation

## 2015-06-18 DIAGNOSIS — R0981 Nasal congestion: Secondary | ICD-10-CM | POA: Insufficient documentation

## 2015-06-18 LAB — BASIC METABOLIC PANEL
Anion gap: 7 (ref 5–15)
BUN: 9 mg/dL (ref 6–20)
CO2: 30 mmol/L (ref 22–32)
Calcium: 9.1 mg/dL (ref 8.9–10.3)
Chloride: 104 mmol/L (ref 101–111)
Creatinine, Ser: 0.97 mg/dL (ref 0.61–1.24)
GFR calc Af Amer: >60 mL/min (ref >60)
GFR calc non Af Amer: >60 mL/min (ref >60)
Glucose, Bld: 94 mg/dL (ref 65–99)
Potassium: 4.3 mmol/L (ref 3.5–5.1)
Sodium: 141 mmol/L (ref 135–145)

## 2015-06-18 LAB — URINALYSIS, ROUTINE W REFLEX MICROSCOPIC
Bilirubin Urine: NEGATIVE
Glucose, UA: NEGATIVE mg/dL
Hgb urine dipstick: NEGATIVE
Ketones, ur: NEGATIVE mg/dL
Leukocytes, UA: NEGATIVE
Nitrite: NEGATIVE
Protein, ur: NEGATIVE mg/dL
Specific Gravity, Urine: >1.03 — ABNORMAL HIGH (ref 1.005–1.030)
pH: 6 (ref 5.0–8.0)

## 2015-06-18 LAB — CBC
HCT: 47.2 % (ref 39.0–52.0)
Hemoglobin: 15.6 g/dL (ref 13.0–17.0)
MCH: 28.8 pg (ref 26.0–34.0)
MCHC: 33.1 g/dL (ref 30.0–36.0)
MCV: 87.1 fL (ref 78.0–100.0)
Platelets: 261 10*3/uL (ref 150–400)
RBC: 5.42 MIL/uL (ref 4.22–5.81)
RDW: 14.3 % (ref 11.5–15.5)
WBC: 9.3 10*3/uL (ref 4.0–10.5)

## 2015-06-18 MED ORDER — FLUTICASONE PROPIONATE 50 MCG/ACT NA SUSP: NASAL | Status: DC

## 2015-06-18 MED ORDER — MECLIZINE HCL 12.5 MG PO TABS
25.0000 mg | ORAL_TABLET | Freq: Once | ORAL | Status: AC
  Administered 2015-06-18: 25 mg via ORAL
  Filled 2015-06-18: qty 2

## 2015-06-18 MED ORDER — MECLIZINE HCL 25 MG PO TABS: 25.0000 mg | ORAL_TABLET | Freq: Three times a day (TID) | ORAL | Status: DC | PRN

## 2015-06-18 NOTE — Discharge Instructions (Signed)
Take medication for at least 48 hours, and until 24 hours without symptoms. No driving until 24 hours without symptoms.  Benign Positional Vertigo Vertigo is the feeling that you or your surroundings are moving when they are not. Benign positional vertigo is the most common form of vertigo. The cause of this condition is not serious (is benign). This condition is triggered by certain movements and positions (is positional). This condition can be dangerous if it occurs while you are doing something that could endanger you or others, such as driving.  CAUSES In many cases, the cause of this condition is not known. It may be caused by a disturbance in an area of the inner ear that helps your brain to sense movement and balance. This disturbance can be caused by a viral infection (labyrinthitis), head injury, or repetitive motion. RISK FACTORS This condition is more likely to develop in:  Women.  People who are 6 years of age or older. SYMPTOMS Symptoms of this condition usually happen when you move your head or your eyes in different directions. Symptoms may start suddenly, and they usually last for less than a minute. Symptoms may include:  Loss of balance and falling.  Feeling like you are spinning or moving.  Feeling like your surroundings are spinning or moving.  Nausea and vomiting.  Blurred vision.  Dizziness.  Involuntary eye movement (nystagmus). Symptoms can be mild and cause only slight annoyance, or they can be severe and interfere with daily life. Episodes of benign positional vertigo may return (recur) over time, and they may be triggered by certain movements. Symptoms may improve over time. DIAGNOSIS This condition is usually diagnosed by medical history and a physical exam of the head, neck, and ears. You may be referred to a health care provider who specializes in ear, nose, and throat (ENT) problems (otolaryngologist) or a provider who specializes in disorders of the  nervous system (neurologist). You may have additional testing, including:  MRI.  A CT scan.  Eye movement tests. Your health care provider may ask you to change positions quickly while he or she watches you for symptoms of benign positional vertigo, such as nystagmus. Eye movement may be tested with an electronystagmogram (ENG), caloric stimulation, the Dix-Hallpike test, or the roll test.  An electroencephalogram (EEG). This records electrical activity in your brain.  Hearing tests. TREATMENT Usually, your health care provider will treat this by moving your head in specific positions to adjust your inner ear back to normal. Surgery may be needed in severe cases, but this is rare. In some cases, benign positional vertigo may resolve on its own in 2-4 weeks. HOME CARE INSTRUCTIONS Safety  Move slowly.Avoid sudden body or head movements.  Avoid driving.  Avoid operating heavy machinery.  Avoid doing any tasks that would be dangerous to you or others if a vertigo episode would occur.  If you have trouble walking or keeping your balance, try using a cane for stability. If you feel dizzy or unstable, sit down right away.  Return to your normal activities as told by your health care provider. Ask your health care provider what activities are safe for you. General Instructions  Take over-the-counter and prescription medicines only as told by your health care provider.  Avoid certain positions or movements as told by your health care provider.  Drink enough fluid to keep your urine clear or pale yellow.  Keep all follow-up visits as told by your health care provider. This is important. SEEK MEDICAL CARE  IF:  You have a fever.  Your condition gets worse or you develop new symptoms.  Your family or friends notice any behavioral changes.  Your nausea or vomiting gets worse.  You have numbness or a "pins and needles" sensation. SEEK IMMEDIATE MEDICAL CARE IF:  You have  difficulty speaking or moving.  You are always dizzy.  You faint.  You develop severe headaches.  You have weakness in your legs or arms.  You have changes in your hearing or vision.  You develop a stiff neck.  You develop sensitivity to light.   This information is not intended to replace advice given to you by your health care provider. Make sure you discuss any questions you have with your health care provider.   Document Released: 03/16/2006 Document Revised: 02/27/2015 Document Reviewed: 10/01/2014 Else[employer] Interactive Patient Education [employer]

## 2015-06-18 NOTE — ED Notes (Signed)
Pt reports dizziness for about a week that comes and goes. States he feels like he has been drinking alcohol but has not. Pt. Alert and oriented, ambulatory to triage.

## 2015-06-18 NOTE — ED Provider Notes (Signed)
CSN: [id number]     Arrival date & time 06/18/15  1757 History   First MD Initiated Contact with Patient 06/18/15 2145     Chief Complaint  Patient presents with  . Dizziness      HPI  Patient resides evaluation 7 days of dizziness. Describes a feeling like he is "drunk even when not drinking". Feels like he is swaying or things are moving around. Describes some nasal congestion and some pressure behind his right eye. No frank headache. Symptoms rhinitis. No ear pain. No chest pain shortness of breath palpitations. No nausea vomiting. No numbness weakness tingling to extremities.  Past Medical History  Diagnosis Date  . Hypertension   . Heart murmur    History reviewed. No pertinent past surgical history. Family History  Problem Relation Age of Onset  . Cancer Father   . Hypertension Father   . Hypertension Mother    Social History  Substance Use Topics  . Smoking status: Current Every Day Smoker -- 0.50 packs/day    Types: Cigarettes  . Smokeless tobacco: None  . Alcohol Use: Yes     Comment: occ    Review of Systems  Constitutional: Negative for fever, chills, diaphoresis, appetite change and fatigue.  HENT: Negative for mouth sores, sore throat and trouble swallowing.   Eyes: Negative for visual disturbance.  Respiratory: Negative for cough, chest tightness, shortness of breath and wheezing.   Cardiovascular: Negative for chest pain.  Gastrointestinal: Negative for nausea, vomiting, abdominal pain, diarrhea and abdominal distention.  Endocrine: Negative for polydipsia, polyphagia and polyuria.  Genitourinary: Negative for dysuria, frequency and hematuria.  Musculoskeletal: Negative for gait problem.  Skin: Negative for color change, pallor and rash.  Neurological: Positive for dizziness. Negative for syncope, light-headedness and headaches.  Hematological: Does not bruise/bleed easily.  Psychiatric/Behavioral: Negative for behavioral problems and confusion.       Allergies  Review of patient's allergies indicates no known allergies.  Home Medications   Prior to Admission medications   Medication Sig Start Date End Date Taking? Authorizing Provider  fluticasone (FLONASE) 50 MCG/ACT nasal spray 1 spray each nares twice a day 06/18/15   Rolland Porter, MD  HYDROcodone-homatropine [facility]) 5-1.5 MG/5ML syrup Take 5 mLs by mouth every 6 (six) hours as needed. Patient not taking: Reported on 06/18/2015 01/21/15   Ivery Quale, PA-C  loratadine-pseudoephedrine (CLARITIN-D 12 HOUR) 5-120 MG per tablet Take 1 tablet by mouth 2 (two) times daily. Patient not taking: Reported on 06/18/2015 01/21/15   Ivery Quale, PA-C  meclizine (ANTIVERT) 25 MG tablet Take 1 tablet (25 mg total) by mouth 3 (three) times daily as needed. 06/18/15   Rolland Porter, MD   BP 144/100 mmHg  Pulse 76  Temp(Src) 98.2 F (36.8 C) (Oral)  Resp 18  Ht 6' (1.829 m)  Wt 356 lb (161.481 kg)  BMI 48.27 kg/m2  SpO2 100% Physical Exam  Constitutional: He is oriented to person, place, and time. He appears well-developed and well-nourished. No distress.  HENT:  Head: Normocephalic.  Horizontal nystagmus to lateral gaze. Does reproduce his symptoms.  Eyes: Conjunctivae are normal. Pupils are equal, round, and reactive to light. No scleral icterus.  Neck: Normal range of motion. Neck supple. No thyromegaly present.  Cardiovascular: Normal rate and regular rhythm.  Exam reveals no gallop and no friction rub.   No murmur heard. Pulmonary/Chest: Effort normal and breath sounds normal. No respiratory distress. He has no wheezes. He has no rales.  Abdominal: Soft. Bowel sounds are  normal. He exhibits no distension. There is no tenderness. There is no rebound.  Musculoskeletal: Normal range of motion.  Neurological: He is alert and oriented to person, place, and time. He has normal strength. No cranial nerve deficit or sensory deficit. He displays a negative Romberg sign. Coordination  normal.  Skin: Skin is warm and dry. No rash noted.  Psychiatric: He has a normal mood and affect. His behavior is normal.    ED Course  Procedures (including critical care time) Labs Review Labs Reviewed  URINALYSIS, ROUTINE W REFLEX MICROSCOPIC (NOT [facility]) - Abnormal; Notable for the following:    Specific Gravity, Urine >1.030 (*)    All other components within normal limits  BASIC METABOLIC PANEL  CBC    Imaging Review Ct Head Wo Contrast  06/18/2015  CLINICAL DATA:  Intermittent dizziness for 1 week. History of hypertension. EXAM: CT HEAD WITHOUT CONTRAST TECHNIQUE: Contiguous axial images were obtained from the base of the skull through the vertex without intravenous contrast. COMPARISON:  None. FINDINGS: The ventricles and sulci are normal. No intraparenchymal hemorrhage, mass effect nor midline shift. No acute large vascular territory infarcts. No abnormal extra-axial fluid collections. Basal cisterns are patent. No skull fracture. The included ocular globes and orbital contents are non-suspicious. The mastoid aircells and included paranasal sinuses are well-aerated. LEFT suboccipital scalp scarring. IMPRESSION: Normal noncontrast CT head. Electronically Signed   By: Awilda Metroourtnay  Bloomer M.D.   On: 06/18/2015 21:58   I have personally reviewed and evaluated these images and lab results as part of my medical decision-making.   EKG Interpretation None      MDM   Final diagnoses:  Vertigo  Nasal congestion    Normal CT. Normal neurological exam with exception of some horizontal nystagmus. Mild symptomatically vertigo. Given by mouth meclizine.  Discharge home. CT is not show sinusitis. Plan Flonase for his nasal congestion, Antivert until 24 hours without symptoms. No driving until 24 hours without symptoms.    Rolland PorterMark Melissia Lahman, MD 06/18/15 [phone]

## 2015-10-28 IMAGING — DX DG CHEST 2V
2 series · 2 of 2 positions shown · non-contrast
Comparison: 12/21/2011

CLINICAL DATA: Sinus congestion and cough for 2 weeks, fever this
morning, history hypertension and smoking

EXAM:
CHEST  2 VIEW

[chest pa]
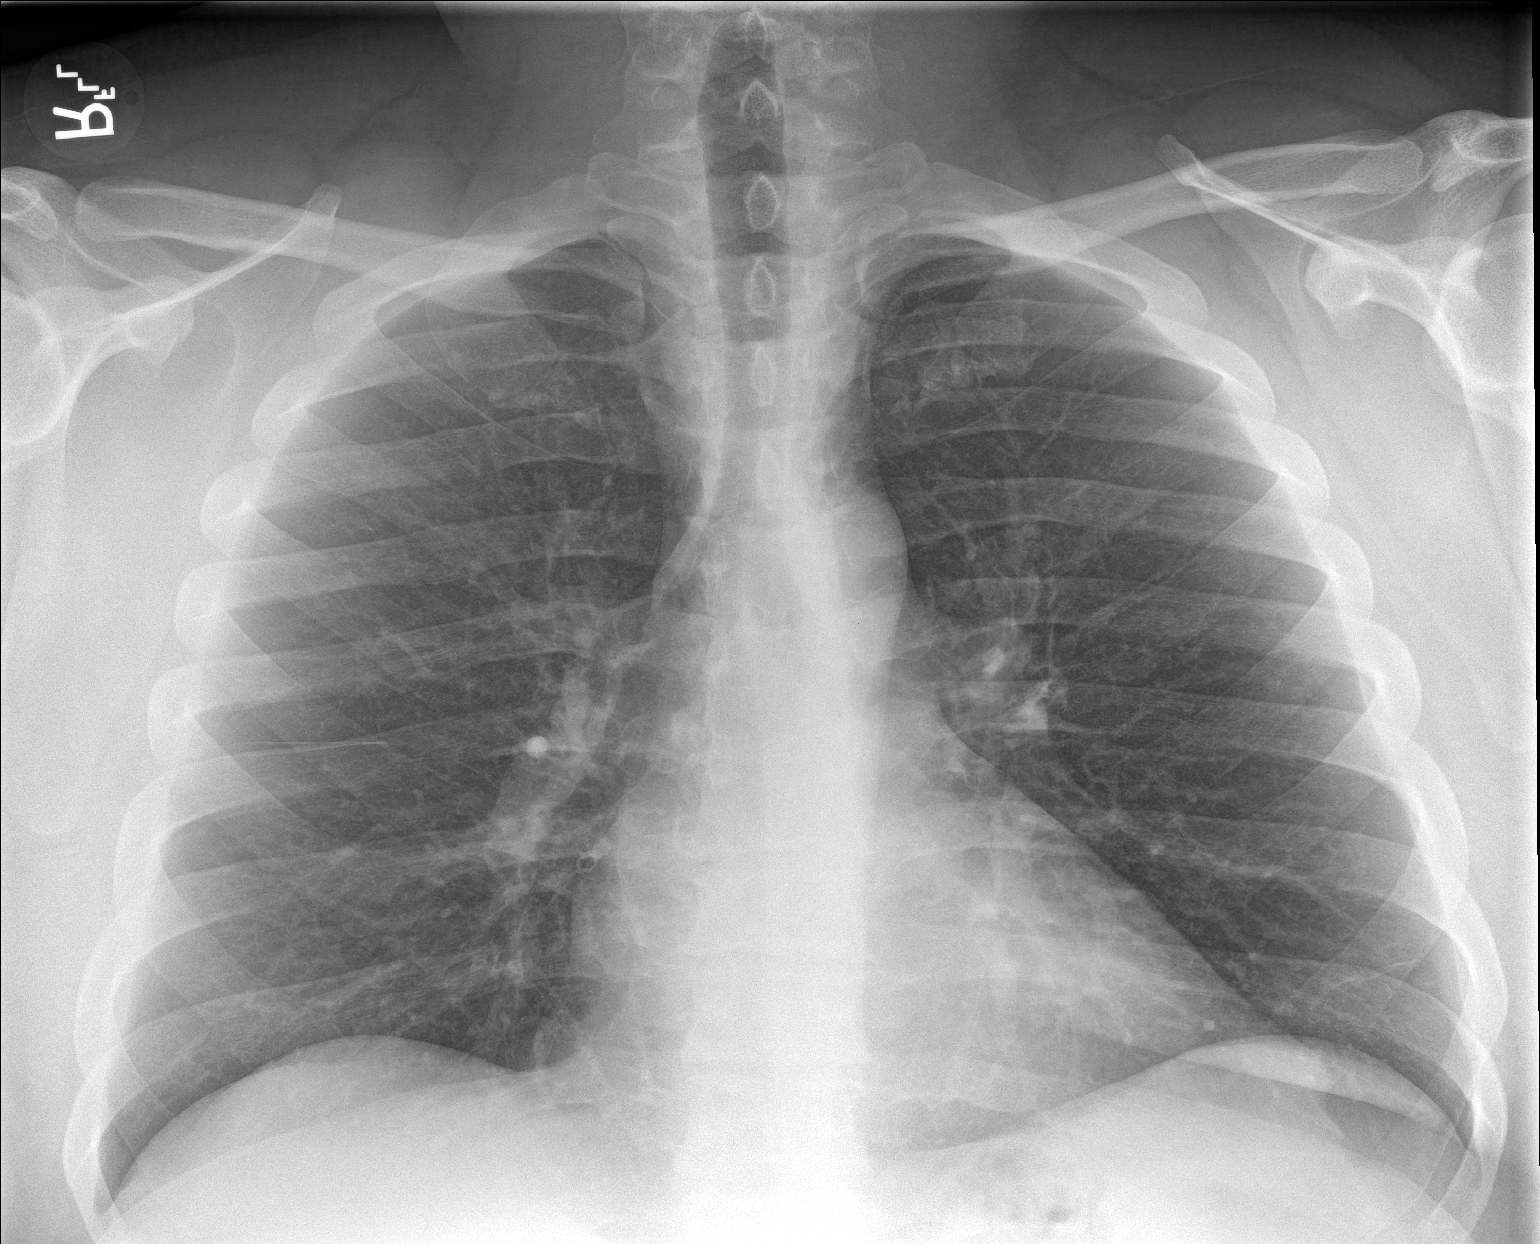

[chest lat]
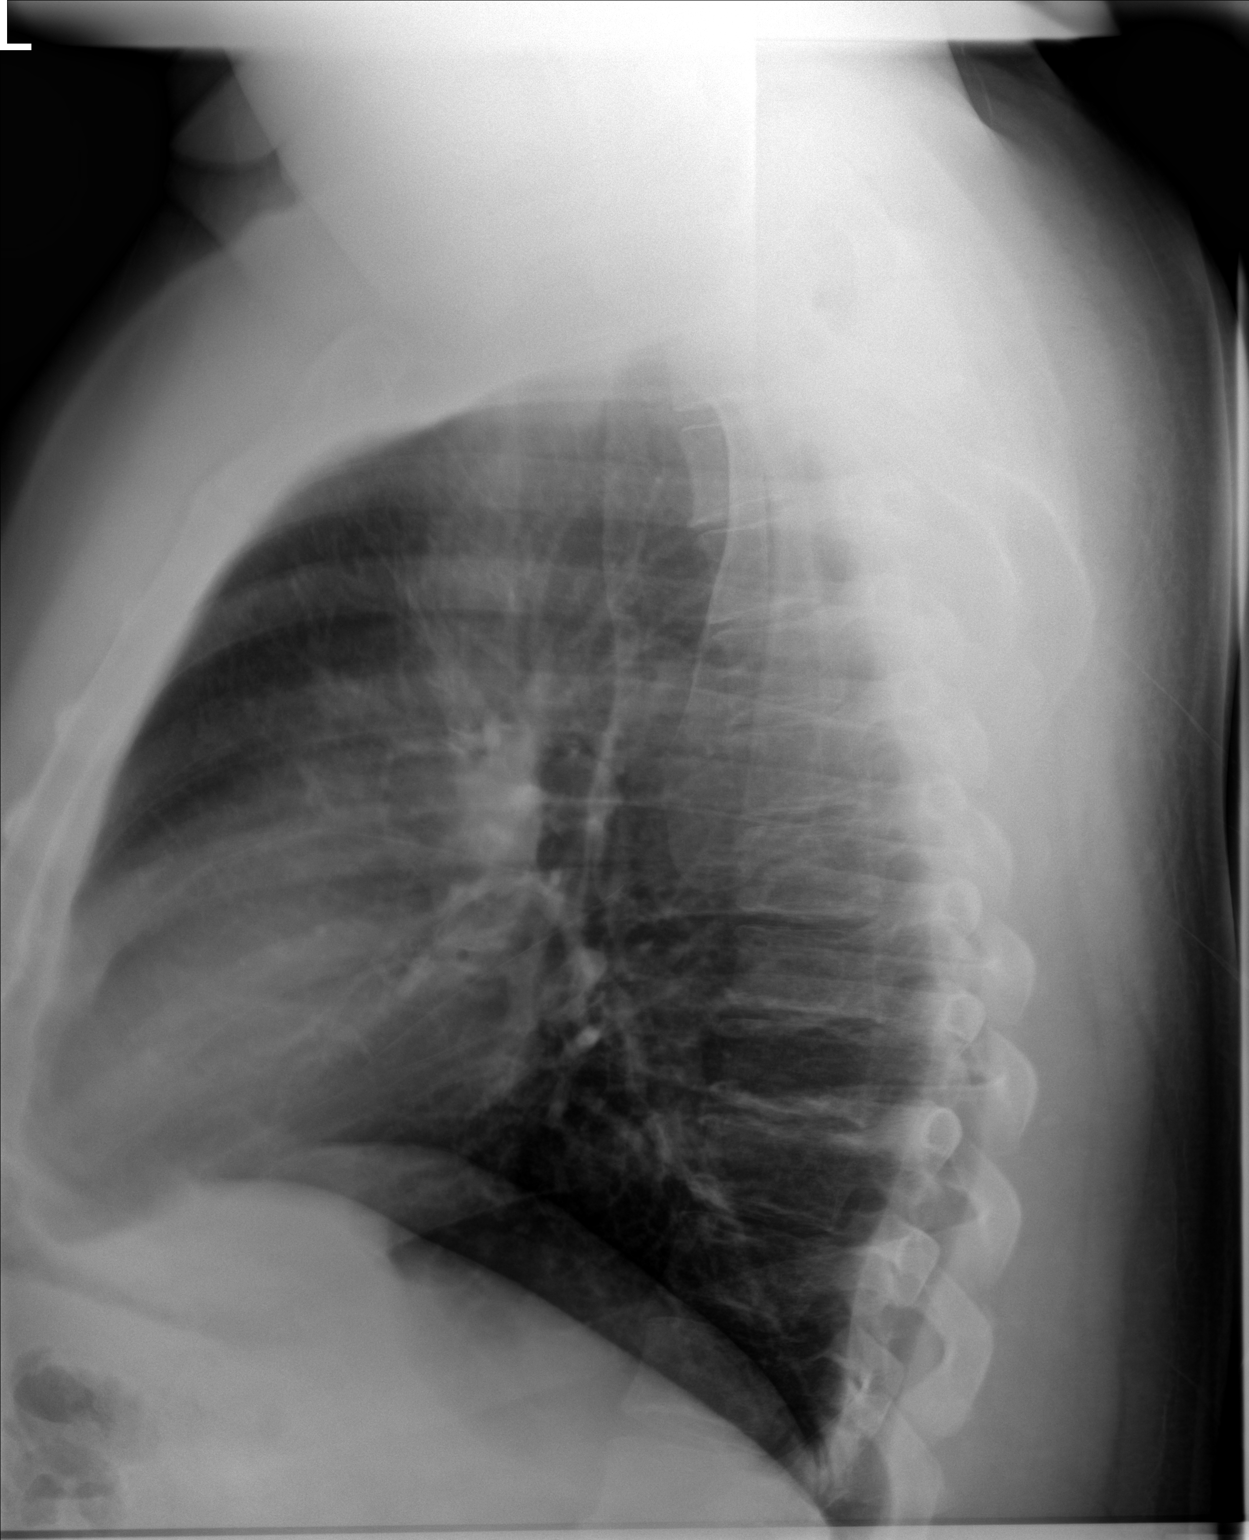

[2 of 2 positions shown; findings below may reference images not displayed]

FINDINGS: Upper normal heart size.

Mediastinal contours and pulmonary vascularity normal.

Central peribronchial thickening.

Mild motion artifact degrades lateral view.

Tiny linear scar LEFT base.

No acute infiltrate, pleural effusion or pneumothorax.

Bones unremarkable.
IMPRESSION: Bronchitic changes without acute infiltrate.

## 2016-08-05 ENCOUNTER — Emergency Department (HOSPITAL_COMMUNITY)
Admission: EM | Admit: 2016-08-05 | Discharge: 2016-08-05 | Disposition: A | Discharge status: Home / Self Care | Payer: Self-pay | Attending: Emergency Medicine | Admitting: Emergency Medicine

## 2016-08-05 ENCOUNTER — Encounter (HOSPITAL_COMMUNITY): Payer: Self-pay | Admitting: Emergency Medicine

## 2016-08-05 DIAGNOSIS — I10 Essential (primary) hypertension: Secondary | ICD-10-CM | POA: Insufficient documentation

## 2016-08-05 DIAGNOSIS — B9789 Other viral agents as the cause of diseases classified elsewhere: Secondary | ICD-10-CM

## 2016-08-05 DIAGNOSIS — F17210 Nicotine dependence, cigarettes, uncomplicated: Secondary | ICD-10-CM | POA: Insufficient documentation

## 2016-08-05 DIAGNOSIS — Z79899 Other long term (current) drug therapy: Secondary | ICD-10-CM | POA: Insufficient documentation

## 2016-08-05 DIAGNOSIS — J069 Acute upper respiratory infection, unspecified: Secondary | ICD-10-CM | POA: Insufficient documentation

## 2016-08-05 NOTE — ED Provider Notes (Signed)
[facility]-EMERGENCY DEPT Provider Note   CSN: [id number] Arrival date & time: 08/05/16  9562   By signing my name below, I, Bobbie Stack, attest that this documentation has been prepared under the direction and in the presence of Pricilla Loveless, MD. Electronically Signed: Bobbie Stack, Scribe. 08/05/16. 9:10 AM. History   Chief Complaint Chief Complaint  Patient presents with  . Facial Pain     The history is provided by the patient. No language interpreter was used.  HPI Comments: Travis Beltran is a 41 y.o. male who presents to the Emergency Department complaining of worsening sinus problems since yesterday. Patient reports that he started a new job around 2 weeks ago. He has been working in a dusty work environment. He states that he frequently has sinus problems. He reports sore throat, congestion, sinus pressure, and a dry cough that began yesterday. He states that his sore throat has resolved. He hasn't tried anything to alleviate his symptoms. He denies, difficulty breathing, fever, body aches, and ear pain. He has hx of HTN.  Past Medical History:  Diagnosis Date  . Heart murmur   . Hypertension     There are no active problems to display for this patient.   History reviewed. No pertinent surgical history.     Home Medications    Prior to Admission medications   Medication Sig Start Date End Date Taking? Authorizing Provider  fluticasone (FLONASE) 50 MCG/ACT nasal spray 1 spray each nares twice a day 06/18/15   Rolland Porter, MD  HYDROcodone-homatropine [facility]) 5-1.5 MG/5ML syrup Take 5 mLs by mouth every 6 (six) hours as needed. Patient not taking: Reported on 06/18/2015 01/21/15   Ivery Quale, PA-C  loratadine-pseudoephedrine (CLARITIN-D 12 HOUR) 5-120 MG per tablet Take 1 tablet by mouth 2 (two) times daily. Patient not taking: Reported on 06/18/2015 01/21/15   Ivery Quale, PA-C  meclizine (ANTIVERT) 25 MG tablet Take 1 tablet (25 mg total) by mouth 3  (three) times daily as needed. 06/18/15   Rolland Porter, MD    Family History Family History  Problem Relation Age of Onset  . Cancer Father   . Hypertension Father   . Hypertension Mother     Social History Social History  Substance Use Topics  . Smoking status: Current Every Day Smoker    Packs/day: 0.50    Types: Cigarettes  . Smokeless tobacco: Never Used  . Alcohol use Yes     Comment: occ     Allergies   Patient has no known allergies.   Review of Systems Review of Systems  Constitutional: Negative for fever.  HENT: Positive for congestion, sinus pressure and sore throat. Negative for ear pain.   Cardiovascular: Negative for chest pain.  Gastrointestinal: Negative for abdominal pain.  All other systems reviewed and are negative.    Physical Exam Updated Vital Signs BP 159/98   Pulse 85   Temp 97.9 F (36.6 C)   Resp 18   Ht 6\' 1"  (1.854 m)   Wt (!) 356 lb (161.5 kg)   SpO2 98%   BMI 46.97 kg/m   Physical Exam  Constitutional: He is oriented to person, place, and time. He appears well-developed and well-nourished. No distress.  HENT:  Head: Normocephalic and atraumatic.  Right Ear: External ear normal.  Left Ear: External ear normal.  Nose: Nose normal. Right sinus exhibits no maxillary sinus tenderness and no frontal sinus tenderness. Left sinus exhibits no maxillary sinus tenderness and no frontal sinus tenderness.  Mouth/Throat:  Oropharynx is clear and moist.  Eyes: Right eye exhibits no discharge. Left eye exhibits no discharge.  Neck: Neck supple.  Cardiovascular: Normal rate, regular rhythm and normal heart sounds.   Pulmonary/Chest: Effort normal and breath sounds normal.  Abdominal: Soft. There is no tenderness.  Musculoskeletal: He exhibits no edema.  Neurological: He is alert and oriented to person, place, and time.  Skin: Skin is warm and dry. He is not diaphoretic.  Nursing note and vitals reviewed.    ED Treatments / Results    DIAGNOSTIC STUDIES: Oxygen Saturation is 98% on RA, normal by my interpretation.    COORDINATION OF CARE: 8:53 AM Discussed treatment plan with pt at bedside and pt agreed to plan. I will check the patient's labs.  Labs (all labs ordered are listed, but only abnormal results are displayed) Labs Reviewed - No data to display  EKG  EKG Interpretation None       Radiology No results found.  Procedures Procedures (including critical care time)  Medications Ordered in ED Medications - No data to display   Initial Impression / Assessment and Plan / ED Course  I have reviewed the triage vital signs and the nursing notes.  Pertinent labs & imaging results that were available during my care of the patient were reviewed by me and considered in my medical decision making (see chart for details).     Patient overall appears well. Likely starting a viral URI. At this time no signs/symptoms suggestive of bacterial etiology. Appears well hydrated. Discussed symptomatic management including zyrtec, pseudophed, saline spray, etc. Discussed return precautions.  Final Clinical Impressions(s) / ED Diagnoses   Final diagnoses:  Viral upper respiratory tract infection with cough    New Prescriptions New Prescriptions   No medications on file   I personally performed the services described in this documentation, which was scribed in my presence. The recorded information has been reviewed and is accurate.    Pricilla LovelessScott Himani Corona, MD 08/05/16 [phone]

## 2016-08-05 NOTE — ED Triage Notes (Signed)
Pt c/o ha/nasal congestion and sinus pain/pressure x 2 week. 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

## 2017-05-25 ENCOUNTER — Emergency Department (HOSPITAL_COMMUNITY)
Admission: EM | Admit: 2017-05-25 | Discharge: 2017-05-25 | Disposition: A | Discharge status: Home / Self Care | Payer: Self-pay | Attending: Emergency Medicine | Admitting: Emergency Medicine

## 2017-05-25 ENCOUNTER — Emergency Department (HOSPITAL_COMMUNITY): Payer: Self-pay

## 2017-05-25 ENCOUNTER — Encounter (HOSPITAL_COMMUNITY): Payer: Self-pay

## 2017-05-25 DIAGNOSIS — Z79899 Other long term (current) drug therapy: Secondary | ICD-10-CM | POA: Insufficient documentation

## 2017-05-25 DIAGNOSIS — B9789 Other viral agents as the cause of diseases classified elsewhere: Secondary | ICD-10-CM

## 2017-05-25 DIAGNOSIS — J069 Acute upper respiratory infection, unspecified: Secondary | ICD-10-CM | POA: Insufficient documentation

## 2017-05-25 DIAGNOSIS — B309 Viral conjunctivitis, unspecified: Secondary | ICD-10-CM | POA: Insufficient documentation

## 2017-05-25 DIAGNOSIS — I10 Essential (primary) hypertension: Secondary | ICD-10-CM | POA: Insufficient documentation

## 2017-05-25 DIAGNOSIS — F17210 Nicotine dependence, cigarettes, uncomplicated: Secondary | ICD-10-CM | POA: Insufficient documentation

## 2017-05-25 MED ORDER — GENTAMICIN SULFATE 0.3 % OP SOLN: 1.0000 [drp] | OPHTHALMIC | 0 refills | Status: DC

## 2017-05-25 MED ORDER — PROMETHAZINE-DM 6.25-15 MG/5ML PO SYRP: 5.0000 mL | ORAL_SOLUTION | Freq: Four times a day (QID) | ORAL | 0 refills | Status: DC | PRN

## 2017-05-25 MED ORDER — ALBUTEROL SULFATE HFA 108 (90 BASE) MCG/ACT IN AERS
1.0000 | INHALATION_SPRAY | Freq: Four times a day (QID) | RESPIRATORY_TRACT | 0 refills | Status: DC | PRN
Start: 2017-05-25 — End: 2020-07-02

## 2017-05-25 MED ORDER — PREDNISONE 20 MG PO TABS: 20.0000 mg | ORAL_TABLET | Freq: Two times a day (BID) | ORAL | 0 refills | Status: DC

## 2017-05-25 NOTE — ED Notes (Signed)
edp aware of bp, instructed pt to take his meds when he gets home and follow up with his pcp.

## 2017-05-25 NOTE — ED Triage Notes (Signed)
Pt reports cough and cold symptoms for past few days.  Reports cough is productive and has been having cold sweats.  Also reports redness and drainage from r eye.

## 2017-05-25 NOTE — ED Provider Notes (Signed)
[facility] EMERGENCY DEPARTMENT Provider Note   CSN: [id number] Arrival date & time: 05/25/17  0807     History   Chief Complaint Chief Complaint  Patient presents with  . URI    HPI Roslyn SmilingDouglas L Shipp is a 41 y.o. male. Chief complaint is cough and congestion for 9 days.  HPI: Patient presents with cough since last Sunday. Today is Tuesday. 9 days of symptoms. Starting with coughing and "congestion". Sinus congestion. Lung congestion. Cough. Now cough has become productive of yellow-green sputum. Occasional blood from his nose, but no hemoptysis. No chest pain. No wheezing. He does not feel short of breath.  He is not immunized for influenza this season. He noticed that his eyes were "bloodshot". He did not feel fever until this Sunday, 2 days ago. Nose had chills for the last 2 mornings.  He is a smoker. No history of lung disease.  Past Medical History:  Diagnosis Date  . Heart murmur   . Hypertension     There are no active problems to display for this patient.   History reviewed. No pertinent surgical history.     Home Medications    Prior to Admission medications   Medication Sig Start Date End Date Taking? Authorizing Provider  albuterol (PROVENTIL HFA;VENTOLIN HFA) 108 (90 Base) MCG/ACT inhaler Inhale 1-2 puffs into the lungs every 6 (six) hours as needed for wheezing. 05/25/17   Rolland PorterJames, Letta Cargile, MD  fluticasone Aleda Grana(FLONASE) 50 MCG/ACT nasal spray 1 spray each nares twice a day 06/18/15   Rolland PorterJames, Danikah Budzik, MD  gentamicin (GARAMYCIN) 0.3 % ophthalmic solution Place 1 drop into the left eye every 4 (four) hours. 05/25/17   Rolland PorterJames, Len Kluver, MD  HYDROcodone-homatropine [facility](HYCODAN) 5-1.5 MG/5ML syrup Take 5 mLs by mouth every 6 (six) hours as needed. Patient not taking: Reported on 06/18/2015 01/21/15   Ivery QualeBryant, Hobson, PA-C  loratadine-pseudoephedrine (CLARITIN-D 12 HOUR) 5-120 MG per tablet Take 1 tablet by mouth 2 (two) times daily. Patient not taking: Reported on 06/18/2015 01/21/15    Ivery QualeBryant, Hobson, PA-C  meclizine (ANTIVERT) 25 MG tablet Take 1 tablet (25 mg total) by mouth 3 (three) times daily as needed. 06/18/15   Rolland PorterJames, Hazelene Doten, MD  predniSONE (DELTASONE) 20 MG tablet Take 1 tablet (20 mg total) by mouth 2 (two) times daily with a meal. 05/25/17   Rolland PorterJames, Vaughan Garfinkle, MD  promethazine-dextromethorphan (PROMETHAZINE-DM) 6.25-15 MG/5ML syrup Take 5 mLs by mouth 4 (four) times daily as needed for cough. 05/25/17   Rolland PorterJames, Cherrelle Plante, MD    Family History Family History  Problem Relation Age of Onset  . Cancer Father   . Hypertension Father   . Hypertension Mother     Social History Social History   Tobacco Use  . Smoking status: Current Every Day Smoker    Packs/day: 0.50    Types: Cigarettes  . Smokeless tobacco: Never Used  Substance Use Topics  . Alcohol use: Yes    Comment: occ  . Drug use: No     Allergies   Patient has no known allergies.   Review of Systems Review of Systems  Constitutional: Positive for chills. Negative for appetite change, diaphoresis, fatigue and fever.  HENT: Positive for congestion. Negative for mouth sores, sore throat and trouble swallowing.   Eyes: Positive for redness. Negative for visual disturbance.  Respiratory: Positive for cough. Negative for chest tightness, shortness of breath and wheezing.   Cardiovascular: Negative for chest pain.  Gastrointestinal: Negative for abdominal distention, abdominal pain, diarrhea, nausea and vomiting.  Endocrine: Negative for polydipsia, polyphagia and polyuria.  Genitourinary: Negative for dysuria, frequency and hematuria.  Musculoskeletal: Negative for gait problem.  Skin: Negative for color change, pallor and rash.  Neurological: Negative for dizziness, syncope, light-headedness and headaches.  Hematological: Does not bruise/bleed easily.  Psychiatric/Behavioral: Negative for behavioral problems and confusion.     Physical Exam Updated Vital Signs BP (!) 159/109 (BP Location: Left Arm)    Pulse 100   Temp 98.2 F (36.8 C) (Oral)   Resp 20   Ht 6\' 2"  (1.88 m)   Wt (!) 160.1 kg (353 lb)   SpO2 95%   BMI 45.32 kg/m   Physical Exam  Constitutional: He is oriented to person, place, and time. He appears well-developed and well-nourished. No distress.  HENT:  Head: Normocephalic.  Eyes: Conjunctivae are normal. Pupils are equal, round, and reactive to light. No scleral icterus.  Diffuse conjunctival injection. Subconjunctival hemorrhages bilateral eyes.  Neck: Normal range of motion. Neck supple. No thyromegaly present.  Cardiovascular: Normal rate and regular rhythm. Exam reveals no gallop and no friction rub.  No murmur heard. Pulmonary/Chest: Effort normal and breath sounds normal. No respiratory distress. He has no wheezes. He has no rales.  No abnormal breath sounds. No wheezing rales or rhonchi. No prolongation. He has large stature, 353 pounds which does limit pulmonary exam somewhat. No increased work of breathing  Abdominal: Soft. Bowel sounds are normal. He exhibits no distension. There is no tenderness. There is no rebound.  Musculoskeletal: Normal range of motion.  Neurological: He is alert and oriented to person, place, and time.  Skin: Skin is warm and dry. No rash noted.  Psychiatric: He has a normal mood and affect. His behavior is normal.     ED Treatments / Results  Labs (all labs ordered are listed, but only abnormal results are displayed) Labs Reviewed - No data to display  EKG  EKG Interpretation None       Radiology Dg Chest 2 View  Result Date: 05/25/2017 CLINICAL DATA:  Cough EXAM: CHEST  2 VIEW COMPARISON:  01/21/2015 FINDINGS: There are mild bilateral chronic bronchitic changes. There is no focal parenchymal opacity. There is no pleural effusion or pneumothorax. The heart and mediastinal contours are unremarkable. The osseous structures are unremarkable. IMPRESSION: No active cardiopulmonary disease. Electronically Signed   By: Elige KoHetal   Patel   On: 05/25/2017 09:06    Procedures Procedures (including critical care time)  Medications Ordered in ED Medications - No data to display   Initial Impression / Assessment and Plan / ED Course  I have reviewed the triage vital signs and the nursing notes.  Pertinent labs & imaging results that were available during my care of the patient were reviewed by me and considered in my medical decision making (see chart for details).    No staccato cough. No wheezing. No distress. He had URI symptoms and is now chilling.   Patient with cough, coryza, conjunctivitis. Does not have oral lesions. No rash. Likely acute viral syndrome. Does have bronchospasm with cough. None at rest or prolongation. X-ray without acute changes of the bronchitic changes. Plan 2 days twice a day prednisone, albuterol, Phenergan DM for cough. Chem eyes drops for his conjunctivitis. Publiic health precautions. Cover his cough. Hand washing. Primary care follow up as needed   Final Clinical Impressions(s) / ED Diagnoses   Final diagnoses:  Viral URI with cough  Acute viral conjunctivitis of both eyes    ED Discharge Orders  Ordered    albuterol (PROVENTIL HFA;VENTOLIN HFA) 108 (90 Base) MCG/ACT inhaler  Every 6 hours PRN     05/25/17 0951    predniSONE (DELTASONE) 20 MG tablet  2 times daily with meals     05/25/17 0951    promethazine-dextromethorphan (PROMETHAZINE-DM) 6.25-15 MG/5ML syrup  4 times daily PRN     05/25/17 0951    gentamicin (GARAMYCIN) 0.3 % ophthalmic solution  Every 4 hours     05/25/17 0951       Rolland Porter, MD 05/25/17 [phone]

## 2018-05-14 ENCOUNTER — Encounter (HOSPITAL_COMMUNITY): Payer: Self-pay | Admitting: Emergency Medicine

## 2018-05-14 ENCOUNTER — Other Ambulatory Visit: Payer: Self-pay

## 2018-05-14 ENCOUNTER — Emergency Department (HOSPITAL_COMMUNITY)
Admission: EM | Admit: 2018-05-14 | Discharge: 2018-05-15 | Disposition: A | Discharge status: Home / Self Care | Payer: Self-pay | Attending: Emergency Medicine | Admitting: Emergency Medicine

## 2018-05-14 DIAGNOSIS — Y9389 Activity, other specified: Secondary | ICD-10-CM | POA: Insufficient documentation

## 2018-05-14 DIAGNOSIS — I10 Essential (primary) hypertension: Secondary | ICD-10-CM | POA: Insufficient documentation

## 2018-05-14 DIAGNOSIS — S61213A Laceration without foreign body of left middle finger without damage to nail, initial encounter: Secondary | ICD-10-CM | POA: Insufficient documentation

## 2018-05-14 DIAGNOSIS — Z79899 Other long term (current) drug therapy: Secondary | ICD-10-CM | POA: Insufficient documentation

## 2018-05-14 DIAGNOSIS — W208XXA Other cause of strike by thrown, projected or falling object, initial encounter: Secondary | ICD-10-CM | POA: Insufficient documentation

## 2018-05-14 DIAGNOSIS — F17210 Nicotine dependence, cigarettes, uncomplicated: Secondary | ICD-10-CM | POA: Insufficient documentation

## 2018-05-14 DIAGNOSIS — Z23 Encounter for immunization: Secondary | ICD-10-CM | POA: Insufficient documentation

## 2018-05-14 DIAGNOSIS — Y990 Civilian activity done for income or pay: Secondary | ICD-10-CM | POA: Insufficient documentation

## 2018-05-14 DIAGNOSIS — Y9289 Other specified places as the place of occurrence of the external cause: Secondary | ICD-10-CM | POA: Insufficient documentation

## 2018-05-14 DIAGNOSIS — R030 Elevated blood-pressure reading, without diagnosis of hypertension: Secondary | ICD-10-CM

## 2018-05-14 NOTE — ED Triage Notes (Signed)
Pt C/O laceration to the left middle finger. Pt states he dropped a transmission on the finger Wednesday. Pt states he has no feeling in the distal part of the finger.

## 2018-05-15 ENCOUNTER — Emergency Department (HOSPITAL_COMMUNITY): Payer: Self-pay

## 2018-05-15 MED ORDER — POVIDONE-IODINE 10 % EX SOLN
CUTANEOUS | Status: AC
  Filled 2018-05-15: qty 15

## 2018-05-15 MED ORDER — CEPHALEXIN 500 MG PO CAPS: 500.0000 mg | ORAL_CAPSULE | Freq: Four times a day (QID) | ORAL | 0 refills | Status: DC

## 2018-05-15 MED ORDER — TETANUS-DIPHTH-ACELL PERTUSSIS 5-2.5-18.5 LF-MCG/0.5 IM SUSP
0.5000 mL | Freq: Once | INTRAMUSCULAR | Status: AC
  Administered 2018-05-15: 0.5 mL via INTRAMUSCULAR
  Filled 2018-05-15: qty 0.5

## 2018-05-15 MED ORDER — BACITRACIN-NEOMYCIN-POLYMYXIN 400-5-5000 EX OINT
TOPICAL_OINTMENT | Freq: Once | CUTANEOUS | Status: AC
  Administered 2018-05-15: 1 via TOPICAL
  Filled 2018-05-15: qty 1

## 2018-05-15 NOTE — ED Notes (Signed)
Pt ambulatory to waiting room. Pt verbalized understanding of discharge instructions.   

## 2018-05-15 NOTE — Discharge Instructions (Addendum)
Keep you finger clean and dry. Wash twice daily with soap and water, then apply a new layer of neosporin before covering it and applying the finger splint.  You may leave it open when not working or using your hand, but wear the splint to protect it while working.  This should heal without any difficulty, but will take a little longer since it was not stitched initially. You have been prescribed an antibiotic to take to help prevent infection.  Get rechecked for any problems or new concerns with your wound.    Your blood pressure is very elevated tonight at 162/111.  You should have your blood pressure rechecked this week by your doctor. Make sure you are taking your blood pressure medication.

## 2018-05-15 NOTE — ED Provider Notes (Signed)
[facility] EMERGENCY DEPARTMENT Provider Note   CSN: [id number] Arrival date & time: 05/14/18  2330     History   Chief Complaint Chief Complaint  Patient presents with  . Laceration    HPI Roslyn SmilingDouglas L Gilbo is a 42 y.o. male with a history crush injury to is left distal long finger occurring 4 days ago at work.  He accidentally dropped a transmission on the finger causing pain, swelling and laceration.  He reports the laceration was "bone deep" but looks more shallow now but it remains open and he has continued pain at the site. He also endorses decreased sensation to the lateral aspect of the finger tip distal to the laceration site.  He initially used hydrogen peroxide to clean the laceration and has been using neosporin and dressings since.  He denies radiation of pain and is able to flex and extend the finger without much difficulty.  He is right handed.  The history is provided by the patient.    Past Medical History:  Diagnosis Date  . Heart murmur   . Hypertension     There are no active problems to display for this patient.   History reviewed. No pertinent surgical history.      Home Medications    Prior to Admission medications   Medication Sig Start Date End Date Taking? Authorizing Provider  albuterol (PROVENTIL HFA;VENTOLIN HFA) 108 (90 Base) MCG/ACT inhaler Inhale 1-2 puffs into the lungs every 6 (six) hours as needed for wheezing. 05/25/17   Rolland PorterJames, Mark, MD  cephALEXin (KEFLEX) 500 MG capsule Take 1 capsule (500 mg total) by mouth 4 (four) times daily. 05/15/18   Burgess AmorIdol, Demarquis Osley, PA-C  fluticasone (FLONASE) 50 MCG/ACT nasal spray 1 spray each nares twice a day 06/18/15   Rolland PorterJames, Mark, MD  gentamicin (GARAMYCIN) 0.3 % ophthalmic solution Place 1 drop into the left eye every 4 (four) hours. 05/25/17   Rolland PorterJames, Mark, MD  HYDROcodone-homatropine [facility](HYCODAN) 5-1.5 MG/5ML syrup Take 5 mLs by mouth every 6 (six) hours as needed. Patient not taking: Reported on 06/18/2015  01/21/15   Ivery QualeBryant, Hobson, PA-C  loratadine-pseudoephedrine (CLARITIN-D 12 HOUR) 5-120 MG per tablet Take 1 tablet by mouth 2 (two) times daily. Patient not taking: Reported on 06/18/2015 01/21/15   Ivery QualeBryant, Hobson, PA-C  meclizine (ANTIVERT) 25 MG tablet Take 1 tablet (25 mg total) by mouth 3 (three) times daily as needed. 06/18/15   Rolland PorterJames, Mark, MD  predniSONE (DELTASONE) 20 MG tablet Take 1 tablet (20 mg total) by mouth 2 (two) times daily with a meal. 05/25/17   Rolland PorterJames, Mark, MD  promethazine-dextromethorphan (PROMETHAZINE-DM) 6.25-15 MG/5ML syrup Take 5 mLs by mouth 4 (four) times daily as needed for cough. 05/25/17   Rolland PorterJames, Mark, MD    Family History Family History  Problem Relation Age of Onset  . Cancer Father   . Hypertension Father   . Hypertension Mother     Social History Social History   Tobacco Use  . Smoking status: Current Every Day Smoker    Packs/day: 0.50    Types: Cigarettes  . Smokeless tobacco: Never Used  Substance Use Topics  . Alcohol use: Yes    Comment: occ  . Drug use: No     Allergies   Patient has no known allergies.   Review of Systems Review of Systems  Constitutional: Negative for fever.  Musculoskeletal: Positive for arthralgias and joint swelling. Negative for myalgias.  Skin: Positive for wound.  Neurological: Negative for weakness and numbness.  Physical Exam Updated Vital Signs BP (!) 159/100   Pulse 77   Temp 97.8 F (36.6 C) (Oral)   Resp 18   SpO2 98%   Physical Exam  Constitutional: He appears well-developed and well-nourished.  HENT:  Head: Atraumatic.  Neck: Normal range of motion.  Cardiovascular:  Pulses equal bilaterally  Musculoskeletal: He exhibits tenderness.       Left hand: He exhibits tenderness, bony tenderness and swelling. He exhibits normal capillary refill. Decreased sensation noted. Decreased sensation is present in the radial distribution. Normal strength noted.  Decreased sensation to fine touch  distal radial side finger tip (touch present but reduced compared to ulnar side of fingertip) no deformity, moderate edema, no erythema. 1 cm laceration volar fingertip, radial side, not well approximated but with good granulation tissue present. No drainage. No red streaking.  Neurological: He is alert. He has normal strength. He displays normal reflexes. No sensory deficit.  Skin: Skin is warm and dry.  Psychiatric: He has a normal mood and affect.     ED Treatments / Results  Labs (all labs ordered are listed, but only abnormal results are displayed) Labs Reviewed - No data to display  EKG None  Radiology Dg Finger Middle Left  Result Date: 05/15/2018 CLINICAL DATA:  Middle finger laceration EXAM: LEFT MIDDLE FINGER 2+V COMPARISON:  None. FINDINGS: There is no evidence of fracture or dislocation. There is no evidence of arthropathy or other focal bone abnormality. Soft tissues are unremarkable. IMPRESSION: Negative. Electronically Signed   By: Deatra Robinson M.D.   On: 05/15/2018 01:21    Procedures Procedures (including critical care time)  Medications Ordered in ED Medications  neomycin-bacitracin-polymyxin (NEOSPORIN) ointment (1 application Topical Given 05/15/18 0201)  Tdap (BOOSTRIX) injection 0.5 mL (0.5 mLs Intramuscular Given 05/15/18 0156)     Initial Impression / Assessment and Plan / ED Course  I have reviewed the triage vital signs and the nursing notes.  Pertinent labs & imaging results that were available during my care of the patient were reviewed by me and considered in my medical decision making (see chart for details).     Laceration from crush injury left distal fingertip. No fx, imaging reviewed and discussed with pt.  Advised suturing would be contraindicated at this time and the wound appears to be healing well.  Advised bid soap and water wash, may continue neosporin, finger splint applied to protect the site. Return precautions discussed. Tetanus  updated.  Also noted bp elevated, advised he needs bp recheck within 1 week, f/u with pcp.  Final Clinical Impressions(s) / ED Diagnoses   Final diagnoses:  Laceration of left middle finger without foreign body without damage to nail, initial encounter  Elevated blood pressure reading    ED Discharge Orders         Ordered    cephALEXin (KEFLEX) 500 MG capsule  4 times daily     05/15/18 0145           Burgess Amor, PA-C 05/15/18 1304    Devoria Albe, MD 05/17/18 1733

## 2019-07-04 ENCOUNTER — Other Ambulatory Visit: Payer: Self-pay

## 2019-07-04 ENCOUNTER — Emergency Department (HOSPITAL_COMMUNITY)
Admission: EM | Admit: 2019-07-04 | Discharge: 2019-07-04 | Disposition: A | Discharge status: Home / Self Care | Payer: Self-pay | Attending: Emergency Medicine | Admitting: Emergency Medicine

## 2019-07-04 ENCOUNTER — Encounter (HOSPITAL_COMMUNITY): Payer: Self-pay | Admitting: Emergency Medicine

## 2019-07-04 DIAGNOSIS — R030 Elevated blood-pressure reading, without diagnosis of hypertension: Secondary | ICD-10-CM

## 2019-07-04 DIAGNOSIS — I10 Essential (primary) hypertension: Secondary | ICD-10-CM | POA: Insufficient documentation

## 2019-07-04 DIAGNOSIS — L03211 Cellulitis of face: Secondary | ICD-10-CM | POA: Insufficient documentation

## 2019-07-04 DIAGNOSIS — K047 Periapical abscess without sinus: Secondary | ICD-10-CM | POA: Insufficient documentation

## 2019-07-04 DIAGNOSIS — F17210 Nicotine dependence, cigarettes, uncomplicated: Secondary | ICD-10-CM | POA: Insufficient documentation

## 2019-07-04 DIAGNOSIS — Z79899 Other long term (current) drug therapy: Secondary | ICD-10-CM | POA: Insufficient documentation

## 2019-07-04 MED ORDER — AMOXICILLIN 250 MG PO CAPS
1000.0000 mg | ORAL_CAPSULE | Freq: Once | ORAL | Status: AC
  Administered 2019-07-04: 1000 mg via ORAL
  Filled 2019-07-04: qty 4

## 2019-07-04 MED ORDER — AMOXICILLIN 500 MG PO CAPS: 1000.0000 mg | ORAL_CAPSULE | Freq: Two times a day (BID) | ORAL | 0 refills | Status: DC

## 2019-07-04 NOTE — Discharge Instructions (Signed)
Please take the antibiotic as directed.  Please apply warm compresses to your face as needed.  Return if the pain or swelling seem to be getting worse, or if you start running a high fever.  You will need to see a dentist to have the tooth properly treated.  Your blood pressure was high today.  While some of that may be from pain and from the stress of coming to the hospital, I am concerned that your blood pressure has been elevated every time you have come to the emergency department for the last several years.  Please have your blood pressure rechecked several times over the next 2 weeks.  If it is staying high, you will need to be on medication to control it.  Uncontrolled high blood pressure leads to heart attacks, strokes, kidney failure.

## 2019-07-04 NOTE — ED Provider Notes (Signed)
[facility] EMERGENCY DEPARTMENT Provider Note   CSN: [id number] Arrival date & time: 07/04/19  0201     History Chief Complaint  Patient presents with  . Facial Swelling    [facility] is a 44 y.o. male.  The history is provided by the patient.  He has history of hypertension and comes in complaining of a broken tooth on the right side of his mouth and swelling the right side of his face.  This started about 2 days ago.  He currently rates his facial pain at 8/10.  He has not taken anything to treat it.  Past Medical History:  Diagnosis Date  . Heart murmur   . Hypertension     There are no problems to display for this patient.   History reviewed. No pertinent surgical history.     Family History  Problem Relation Age of Onset  . Cancer Father   . Hypertension Father   . Hypertension Mother     Social History   Tobacco Use  . Smoking status: Current Every Day Smoker    Packs/day: 0.50    Types: Cigarettes  . Smokeless tobacco: Never Used  Substance Use Topics  . Alcohol use: Not Currently    Comment: occ  . Drug use: No    Home Medications Prior to Admission medications   Medication Sig Start Date End Date Taking? Authorizing Provider  albuterol (PROVENTIL HFA;VENTOLIN HFA) 108 (90 Base) MCG/ACT inhaler Inhale 1-2 puffs into the lungs every 6 (six) hours as needed for wheezing. 05/25/17   Rolland Porter, MD  cephALEXin (KEFLEX) 500 MG capsule Take 1 capsule (500 mg total) by mouth 4 (four) times daily. 05/15/18   Burgess Amor, PA-C  fluticasone (FLONASE) 50 MCG/ACT nasal spray 1 spray each nares twice a day 06/18/15   Rolland Porter, MD  gentamicin (GARAMYCIN) 0.3 % ophthalmic solution Place 1 drop into the left eye every 4 (four) hours. 05/25/17   Rolland Porter, MD  HYDROcodone-homatropine [facility]) 5-1.5 MG/5ML syrup Take 5 mLs by mouth every 6 (six) hours as needed. Patient not taking: Reported on 06/18/2015 01/21/15   Ivery Quale, PA-C    loratadine-pseudoephedrine (CLARITIN-D 12 HOUR) 5-120 MG per tablet Take 1 tablet by mouth 2 (two) times daily. Patient not taking: Reported on 06/18/2015 01/21/15   Ivery Quale, PA-C  meclizine (ANTIVERT) 25 MG tablet Take 1 tablet (25 mg total) by mouth 3 (three) times daily as needed. 06/18/15   Rolland Porter, MD  predniSONE (DELTASONE) 20 MG tablet Take 1 tablet (20 mg total) by mouth 2 (two) times daily with a meal. 05/25/17   Rolland Porter, MD  promethazine-dextromethorphan (PROMETHAZINE-DM) 6.25-15 MG/5ML syrup Take 5 mLs by mouth 4 (four) times daily as needed for cough. 05/25/17   Rolland Porter, MD    Allergies    Patient has no known allergies.  Review of Systems   Review of Systems  All other systems reviewed and are negative.   Physical Exam Updated Vital Signs BP (!) 173/106   Pulse 97   Temp 98.4 F (36.9 C)   Resp 20   Ht 6\' 2"  (1.88 m)   Wt (!) 161.5 kg   SpO2 97%   BMI 45.71 kg/m   Physical Exam Vitals and nursing note reviewed.   44 year old male, resting comfortably and in no acute distress. Vital signs are significant for elevated blood pressure. Oxygen saturation is 97%, which is normal. Head is normocephalic and atraumatic. PERRLA, EOMI. Oropharynx is  clear.  There is mild swelling and slight induration of the right side of the face just lateral to the nose.  This is not tense and there is no fluctuance.  Teeth are and generally poor condition, but tooth #6 is broken at the level of the gingival line and there is swelling and tenderness of the overlying gingiva consistent with abscess. Neck is nontender and supple without adenopathy or JVD. Back is nontender and there is no CVA tenderness. Lungs are clear without rales, wheezes, or rhonchi. Chest is nontender. Heart has regular rate and rhythm without murmur. Abdomen is soft, flat, nontender without masses or hepatosplenomegaly and peristalsis is normoactive. Extremities have no cyanosis or edema, full range of  motion is present. Skin is warm and dry without rash. Neurologic: Mental status is normal, cranial nerves are intact, there are no motor or sensory deficits.  ED Results / Procedures / Treatments    Procedures Procedures   Medications Ordered in ED Medications  amoxicillin (AMOXIL) capsule 1,000 mg (has no administration in time range)    ED Course  I have reviewed the triage vital signs and the nursing notes.  Pertinent labs & imaging results that were available MDM Rules/Calculators/A&P Dental abscess with secondary facial cellulitis.  He is discharged with a prescription for amoxicillin and is given dental resource guide.  Advised that his problem will not get better until the tooth is properly taken care of.  Also, advised that blood pressure is elevated he needs to have it checked as an outpatient.  Need for treatment of persistently elevated blood pressure was stressed.  Final Clinical Impression(s) / ED Diagnoses Final diagnoses:  Facial cellulitis  Dental abscess    Rx / DC Orders ED Discharge Orders    None       Delora Fuel, MD 29/92/42 [phone]

## 2019-07-04 NOTE — ED Triage Notes (Signed)
Pt c/o right sided facial swelling since Sunday night.

## 2020-06-25 ENCOUNTER — Other Ambulatory Visit: Payer: Self-pay

## 2020-06-25 ENCOUNTER — Emergency Department (HOSPITAL_COMMUNITY)
Admission: EM | Admit: 2020-06-25 | Discharge: 2020-06-25 | Disposition: A | Discharge status: Home / Self Care | Payer: 59 | Attending: Emergency Medicine | Admitting: Emergency Medicine

## 2020-06-25 ENCOUNTER — Encounter (HOSPITAL_COMMUNITY): Payer: Self-pay | Admitting: Emergency Medicine

## 2020-06-25 DIAGNOSIS — U071 COVID-19: Secondary | ICD-10-CM | POA: Diagnosis not present

## 2020-06-25 DIAGNOSIS — F17210 Nicotine dependence, cigarettes, uncomplicated: Secondary | ICD-10-CM | POA: Insufficient documentation

## 2020-06-25 DIAGNOSIS — R058 Other specified cough: Secondary | ICD-10-CM | POA: Diagnosis present

## 2020-06-25 DIAGNOSIS — I10 Essential (primary) hypertension: Secondary | ICD-10-CM | POA: Insufficient documentation

## 2020-06-25 LAB — POC SARS CORONAVIRUS 2 AG -  ED: SARS Coronavirus 2 Ag: POSITIVE — AB

## 2020-06-25 NOTE — ED Notes (Signed)
Entered room and introduced self to patient. Pt appears to be resting in bed, respirations are even and unlabored with equal chest rise and fall. Bed is locked in the lowest position, side rails x2, call bell within reach. Pt educated on call light use and hourly rounding, verbalized understanding and in agreement at this time. All questions and concerns voiced addressed. Refreshments offered and provided per patient request.  Will continue to monitor.   

## 2020-06-25 NOTE — ED Triage Notes (Signed)
Pt reports cough, chest congestion, generalized body aches. Pt denies being vaccinated for flu or covid, no known exposures.

## 2020-06-25 NOTE — ED Provider Notes (Signed)
[facility] Provider Note   CSN: [id number] Arrival date & time: 06/25/20  2706     History Chief Complaint  Patient presents with  . Cough    [facility] is a 45 y.o. male.   Cough Cough characteristics:  Productive Sputum characteristics:  Nondescript Severity:  Mild Onset quality:  Sudden Duration:  3 days Timing:  Constant Progression:  Unchanged Chronicity:  New Context: upper respiratory infection   Relieved by:  None tried Worsened by:  Nothing Ineffective treatments:  Cough suppressants Associated symptoms: chills and myalgias   Associated symptoms: no shortness of breath        Past Medical History:  Diagnosis Date  . Heart murmur   . Hypertension     There are no problems to display for this patient.   History reviewed. No pertinent surgical history.     Family History  Problem Relation Age of Onset  . Cancer Father   . Hypertension Father   . Hypertension Mother     Social History   Tobacco Use  . Smoking status: Current Every Day Smoker    Packs/day: 0.50    Types: Cigarettes  . Smokeless tobacco: Never Used  Substance Use Topics  . Alcohol use: Not Currently    Comment: occ  . Drug use: No    Home Medications Prior to Admission medications   Medication Sig Start Date End Date Taking? Authorizing Provider  albuterol (PROVENTIL HFA;VENTOLIN HFA) 108 (90 Base) MCG/ACT inhaler Inhale 1-2 puffs into the lungs every 6 (six) hours as needed for wheezing. 05/25/17   Rolland Porter, MD  amoxicillin (AMOXIL) 500 MG capsule Take 2 capsules (1,000 mg total) by mouth 2 (two) times daily. 07/04/19   Dione Booze, MD  fluticasone [facility]) 50 MCG/ACT nasal spray 1 spray each nares twice a day 06/18/15   Rolland Porter, MD    Allergies    Patient has no known allergies.  Review of Systems   Review of Systems  Constitutional: Positive for chills.  Respiratory: Positive for cough. Negative for shortness of breath.    Gastrointestinal: Negative for vomiting.  Musculoskeletal: Positive for myalgias.  Neurological: Positive for weakness.    Physical Exam Updated Vital Signs BP (!) 170/113 (BP Location: Left Arm)   Pulse 95   Temp 98.4 F (36.9 C) (Oral)   Resp (!) 24   Ht 6\' 2"  (1.88 m)   Wt (!) 175.1 kg   SpO2 97%   BMI 49.56 kg/m   Physical Exam Vitals and nursing note reviewed.  Constitutional:      General: He is not in acute distress.    Appearance: He is well-developed and well-nourished. He is ill-appearing. He is not toxic-appearing or diaphoretic.  HENT:     Head: Normocephalic and atraumatic.  Eyes:     General: No scleral icterus.    Conjunctiva/sclera: Conjunctivae normal.  Cardiovascular:     Rate and Rhythm: Normal rate and regular rhythm.     Heart sounds: Normal heart sounds.  Pulmonary:     Effort: Pulmonary effort is normal. No respiratory distress.     Breath sounds: Normal breath sounds.  Abdominal:     Palpations: Abdomen is soft.     Tenderness: There is no abdominal tenderness.  Musculoskeletal:        General: No edema.     Cervical back: Normal range of motion and neck supple.  Skin:    General: Skin is warm and dry.  Neurological:  Mental Status: He is alert.  Psychiatric:        Behavior: Behavior normal.     ED Results / Procedures / Treatments   Labs (all labs ordered are listed, but only abnormal results are displayed) Labs Reviewed  POC SARS CORONAVIRUS 2 AG -  ED - Abnormal; Notable for the following components:      Result Value   SARS Coronavirus 2 Ag POSITIVE (*)    All other components within normal limits    EKG None  Radiology No results found.  Procedures Procedures (including critical care time)  Medications Ordered in ED Medications - No data to display  ED Course  I have reviewed the triage vital signs and the nursing notes.  Pertinent labs & imaging results that were available during my care of the patient were  reviewed by me and considered in my medical decision making (see chart for details).    MDM Rules/Calculators/A&P                         Patient tested positive for coronavirus.  His vital signs are within normal limits other than some elevated blood pressure which can follow-up with his primary care physician for.  Patient is advised to quarantine per CDC guidelines.  Discussed return precautions.  Work note provided.   [facility] was evaluated in Emergency Department on 06/25/2020 for the symptoms described in the history of present illness. He was evaluated in the context of the global COVID-19 pandemic, which necessitated consideration that the patient might be at risk for infection with the SARS-CoV-2 virus that causes COVID-19. Institutional protocols and algorithms that pertain to the evaluation of patients at risk for COVID-19 are in a state of rapid change based on information released by regulatory bodies including the CDC and federal and state organizations. These policies and algorithms were followed during the patient's care in the ED.  Final Clinical Impression(s) / ED Diagnoses Final diagnoses:  None    Rx / DC Orders ED Discharge Orders    None       Arthor Captain, PA-C 06/25/20 1218    Pollyann Savoy, MD 06/25/20 [phone]

## 2020-06-25 NOTE — Discharge Instructions (Signed)
Person Under Monitoring Name: Travis Beltran  Location: [address] [place] [address]   Infection Prevention Recommendations for Individuals Confirmed to have, or Being Evaluated for, 2019 Novel Coronavirus (COVID-19) Infection Who Receive Care at Home  Individuals who are confirmed to have, or are being evaluated for, COVID-19 should follow the prevention steps below until a healthcare provider or local or state health department says they can return to normal activities.  Stay home except to get medical care You should restrict activities outside your home, except for getting medical care. Do not go to work, school, or public areas, and do not use public transportation or taxis.  Call ahead before visiting your doctor Before your medical appointment, call the healthcare provider and tell them that you have, or are being evaluated for, COVID-19 infection. This will help the healthcare providers office take steps to keep other people from getting infected. Ask your healthcare provider to call the local or state health department.  Monitor your symptoms Seek prompt medical attention if your illness is worsening (e.g., difficulty breathing). Before going to your medical appointment, call the healthcare provider and tell them that you have, or are being evaluated for, COVID-19 infection. Ask your healthcare provider to call the local or state health department.  Wear a facemask You should wear a facemask that covers your nose and mouth when you are in the same room with other people and when you visit a healthcare provider. People who live with or visit you should also wear a facemask while they are in the same room with you.  Separate yourself from other people in your home As much as possible, you should stay in a different room from other people in your home. Also, you should use a separate bathroom, if available.  Avoid sharing household items You should not  share dishes, drinking glasses, cups, eating utensils, towels, bedding, or other items with other people in your home. After using these items, you should wash them thoroughly with soap and water.  Cover your coughs and sneezes Cover your mouth and nose with a tissue when you cough or sneeze, or you can cough or sneeze into your sleeve. Throw used tissues in a lined trash can, and immediately wash your hands with soap and water for at least 20 seconds or use an alcohol-based hand rub.  Wash your [employer] your hands often and thoroughly with soap and water for at least 20 seconds. You can use an alcohol-based hand sanitizer if soap and water are not available and if your hands are not visibly dirty. Avoid touching your eyes, nose, and mouth with unwashed hands.   Prevention Steps for Caregivers and Household Members of Individuals Confirmed to have, or Being Evaluated for, COVID-19 Infection Being Cared for in the Home  If you live with, or provide care at home for, a person confirmed to have, or being evaluated for, COVID-19 infection please follow these guidelines to prevent infection:  Follow healthcare providers instructions Make sure that you understand and can help the patient follow any healthcare provider instructions for all care.  Provide for the patients basic needs You should help the patient with basic needs in the home and provide support for getting groceries, prescriptions, and other personal needs.  Monitor the patients symptoms If they are getting sicker, call his or her medical provider and tell them that the patient has, or is being evaluated for, COVID-19 infection. This will help the healthcare providers office  take steps to keep other people from getting infected. Ask the healthcare provider to call the local or state health department.  Limit the number of people who have contact with the patient If possible, have only one caregiver for the  patient. Other household members should stay in another home or place of residence. If this is not possible, they should stay in another room, or be separated from the patient as much as possible. Use a separate bathroom, if available. Restrict visitors who do not have an essential need to be in the home.  Keep older adults, very young children, and other sick people away from the patient Keep older adults, very young children, and those who have compromised immune systems or chronic health conditions away from the patient. This includes people with chronic heart, lung, or kidney conditions, diabetes, and cancer.  Ensure good ventilation Make sure that shared spaces in the home have good air flow, such as from an air conditioner or an opened window, weather permitting.  Wash your hands often Wash your hands often and thoroughly with soap and water for at least 20 seconds. You can use an alcohol based hand sanitizer if soap and water are not available and if your hands are not visibly dirty. Avoid touching your eyes, nose, and mouth with unwashed hands. Use disposable paper towels to dry your hands. If not available, use dedicated cloth towels and replace them when they become wet.  Wear a facemask and gloves Wear a disposable facemask at all times in the room and gloves when you touch or have contact with the patients blood, body fluids, and/or secretions or excretions, such as sweat, saliva, sputum, nasal mucus, vomit, urine, or feces.  Ensure the mask fits over your nose and mouth tightly, and do not touch it during use. Throw out disposable facemasks and gloves after using them. Do not reuse. Wash your hands immediately after removing your facemask and gloves. If your personal clothing becomes contaminated, carefully remove clothing and launder. Wash your hands after handling contaminated clothing. Place all used disposable facemasks, gloves, and other waste in a lined container before  disposing them with other household waste. Remove gloves and wash your hands immediately after handling these items.  Do not share dishes, glasses, or other household items with the patient Avoid sharing household items. You should not share dishes, drinking glasses, cups, eating utensils, towels, bedding, or other items with a patient who is confirmed to have, or being evaluated for, COVID-19 infection. After the person uses these items, you should wash them thoroughly with soap and water.  Wash laundry thoroughly Immediately remove and wash clothes or bedding that have blood, body fluids, and/or secretions or excretions, such as sweat, saliva, sputum, nasal mucus, vomit, urine, or feces, on them. Wear gloves when handling laundry from the patient. Read and follow directions on labels of laundry or clothing items and detergent. In general, wash and dry with the warmest temperatures recommended on the label.  Clean all areas the individual has used often Clean all touchable surfaces, such as counters, tabletops, doorknobs, bathroom fixtures, toilets, phones, keyboards, tablets, and bedside tables, every day. Also, clean any surfaces that may have blood, body fluids, and/or secretions or excretions on them. Wear gloves when cleaning surfaces the patient has come in contact with. Use a diluted bleach solution (e.g., dilute bleach with 1 part bleach and 10 parts water) or a household disinfectant with a label that says EPA-registered for coronaviruses. To make a bleach  solution at home, add 1 tablespoon of bleach to 1 quart (4 cups) of water. For a larger supply, add  cup of bleach to 1 gallon (16 cups) of water. Read labels of cleaning products and follow recommendations provided on product labels. Labels contain instructions for safe and effective use of the cleaning product including precautions you should take when applying the product, such as wearing gloves or eye protection and making sure you  have good ventilation during use of the product. Remove gloves and wash hands immediately after cleaning.  Monitor yourself for signs and symptoms of illness Caregivers and household members are considered close contacts, should monitor their health, and will be asked to limit movement outside of the home to the extent possible. Follow the monitoring steps for close contacts listed on the symptom monitoring form.   ? If you have additional questions, contact your local health department or call the epidemiologist on call at [phone] (available 24/7). ? This guidance is subject to change. For the most up-to-date guidance from [facility], please refer to their website: [email or web address]

## 2020-06-29 ENCOUNTER — Other Ambulatory Visit: Payer: Self-pay

## 2020-06-29 ENCOUNTER — Emergency Department (HOSPITAL_COMMUNITY): Payer: 59

## 2020-06-29 ENCOUNTER — Inpatient Hospital Stay (HOSPITAL_COMMUNITY)
Admission: EM | Admit: 2020-06-29 | Discharge: 2020-07-02 | DRG: 280 | Disposition: A | Discharge status: Home / Self Care | Payer: 59 | Attending: Internal Medicine | Admitting: Internal Medicine

## 2020-06-29 ENCOUNTER — Other Ambulatory Visit (HOSPITAL_COMMUNITY): Payer: 59

## 2020-06-29 ENCOUNTER — Encounter (HOSPITAL_COMMUNITY): Payer: Self-pay

## 2020-06-29 DIAGNOSIS — N62 Hypertrophy of breast: Secondary | ICD-10-CM | POA: Diagnosis present

## 2020-06-29 DIAGNOSIS — Z8249 Family history of ischemic heart disease and other diseases of the circulatory system: Secondary | ICD-10-CM | POA: Diagnosis not present

## 2020-06-29 DIAGNOSIS — I10 Essential (primary) hypertension: Secondary | ICD-10-CM | POA: Diagnosis present

## 2020-06-29 DIAGNOSIS — F17210 Nicotine dependence, cigarettes, uncomplicated: Secondary | ICD-10-CM | POA: Diagnosis present

## 2020-06-29 DIAGNOSIS — U071 COVID-19: Secondary | ICD-10-CM | POA: Diagnosis present

## 2020-06-29 DIAGNOSIS — G4733 Obstructive sleep apnea (adult) (pediatric): Secondary | ICD-10-CM | POA: Diagnosis present

## 2020-06-29 DIAGNOSIS — Z6841 Body Mass Index (BMI) 40.0 and over, adult: Secondary | ICD-10-CM | POA: Diagnosis not present

## 2020-06-29 DIAGNOSIS — E785 Hyperlipidemia, unspecified: Secondary | ICD-10-CM | POA: Diagnosis present

## 2020-06-29 DIAGNOSIS — K219 Gastro-esophageal reflux disease without esophagitis: Secondary | ICD-10-CM | POA: Diagnosis present

## 2020-06-29 DIAGNOSIS — I214 Non-ST elevation (NSTEMI) myocardial infarction: Principal | ICD-10-CM | POA: Diagnosis present

## 2020-06-29 DIAGNOSIS — I160 Hypertensive urgency: Secondary | ICD-10-CM | POA: Diagnosis present

## 2020-06-29 DIAGNOSIS — Z79899 Other long term (current) drug therapy: Secondary | ICD-10-CM | POA: Diagnosis not present

## 2020-06-29 LAB — URINALYSIS, ROUTINE W REFLEX MICROSCOPIC
Bilirubin Urine: NEGATIVE
Glucose, UA: NEGATIVE mg/dL
Hgb urine dipstick: NEGATIVE
Ketones, ur: NEGATIVE mg/dL
Leukocytes,Ua: NEGATIVE
Nitrite: NEGATIVE
Protein, ur: 100 mg/dL — AB
Specific Gravity, Urine: 1.026 (ref 1.005–1.030)
pH: 5 (ref 5.0–8.0)

## 2020-06-29 LAB — COMPREHENSIVE METABOLIC PANEL
ALT: 20 U/L (ref 0–44)
AST: 23 U/L (ref 15–41)
Albumin: 3.6 g/dL (ref 3.5–5.0)
Alkaline Phosphatase: 81 U/L (ref 38–126)
Anion gap: 8 (ref 5–15)
BUN: 8 mg/dL (ref 6–20)
CO2: 26 mmol/L (ref 22–32)
Calcium: 8.9 mg/dL (ref 8.9–10.3)
Chloride: 105 mmol/L (ref 98–111)
Creatinine, Ser: 0.89 mg/dL (ref 0.61–1.24)
GFR, Estimated: >60 mL/min (ref >60)
Glucose, Bld: 106 mg/dL — ABNORMAL HIGH (ref 70–99)
Potassium: 4 mmol/L (ref 3.5–5.1)
Sodium: 139 mmol/L (ref 135–145)
Total Bilirubin: 0.6 mg/dL (ref 0.3–1.2)
Total Protein: 6.9 g/dL (ref 6.5–8.1)

## 2020-06-29 LAB — TSH: TSH: 1.055 u[IU]/mL (ref 0.350–4.500)

## 2020-06-29 LAB — LIPID PANEL
Cholesterol: 130 mg/dL (ref 0–200)
HDL: 30 mg/dL — ABNORMAL LOW (ref >40)
LDL Cholesterol: 84 mg/dL (ref 0–99)
Total CHOL/HDL Ratio: 4.3 RATIO
Triglycerides: 79 mg/dL (ref <150)
VLDL: 16 mg/dL (ref 0–40)

## 2020-06-29 LAB — CBC
HCT: 48.3 % (ref 39.0–52.0)
Hemoglobin: 15.4 g/dL (ref 13.0–17.0)
MCH: 28.2 pg (ref 26.0–34.0)
MCHC: 31.9 g/dL (ref 30.0–36.0)
MCV: 88.5 fL (ref 80.0–100.0)
Platelets: 287 10*3/uL (ref 150–400)
RBC: 5.46 MIL/uL (ref 4.22–5.81)
RDW: 13.3 % (ref 11.5–15.5)
WBC: 6.3 10*3/uL (ref 4.0–10.5)
nRBC: 0 % (ref 0.0–0.2)

## 2020-06-29 LAB — RAPID URINE DRUG SCREEN, HOSP PERFORMED
Amphetamines: NOT DETECTED
Barbiturates: NOT DETECTED
Benzodiazepines: NOT DETECTED
Cocaine: NOT DETECTED
Opiates: NOT DETECTED
Tetrahydrocannabinol: NOT DETECTED

## 2020-06-29 LAB — HEMOGLOBIN A1C
Hgb A1c MFr Bld: 5.6 % (ref 4.8–5.6)
Mean Plasma Glucose: 114.02 mg/dL

## 2020-06-29 LAB — TROPONIN I (HIGH SENSITIVITY)
Troponin I (High Sensitivity): 101 ng/L (ref <18)
Troponin I (High Sensitivity): 1413 ng/L (ref <18)
Troponin I (High Sensitivity): 2903 ng/L (ref <18)
Troponin I (High Sensitivity): 4970 ng/L (ref <18)

## 2020-06-29 LAB — CBG MONITORING, ED: Glucose-Capillary: 100 mg/dL — ABNORMAL HIGH (ref 70–99)

## 2020-06-29 LAB — HEPARIN LEVEL (UNFRACTIONATED): Heparin Unfractionated: <0.1 IU/mL — ABNORMAL LOW (ref 0.30–0.70)

## 2020-06-29 MED ORDER — OXYCODONE HCL 5 MG PO TABS: 5.0000 mg | ORAL_TABLET | ORAL | Status: DC | PRN

## 2020-06-29 MED ORDER — NITROGLYCERIN IN D5W 200-5 MCG/ML-% IV SOLN
5.0000 ug/min | INTRAVENOUS | Status: DC
  Administered 2020-06-29: 5 ug/min via INTRAVENOUS
  Administered 2020-06-30 – 2020-07-02 (×3): 45 ug/min via INTRAVENOUS
  Filled 2020-06-29 (×4): qty 250

## 2020-06-29 MED ORDER — IOHEXOL 350 MG/ML SOLN
100.0000 mL | Freq: Once | INTRAVENOUS | Status: AC | PRN
  Administered 2020-06-29: 100 mL via INTRAVENOUS

## 2020-06-29 MED ORDER — MORPHINE SULFATE (PF) 2 MG/ML IV SOLN: 2.0000 mg | INTRAVENOUS | Status: DC | PRN

## 2020-06-29 MED ORDER — HEPARIN BOLUS VIA INFUSION
3500.0000 [IU] | Freq: Once | INTRAVENOUS | Status: AC
  Administered 2020-06-29: 3500 [IU] via INTRAVENOUS

## 2020-06-29 MED ORDER — AMLODIPINE BESYLATE 5 MG PO TABS
5.0000 mg | ORAL_TABLET | Freq: Once | ORAL | Status: AC
  Administered 2020-06-29: 5 mg via ORAL
  Filled 2020-06-29: qty 1

## 2020-06-29 MED ORDER — ACETAMINOPHEN 650 MG RE SUPP: 650.0000 mg | Freq: Four times a day (QID) | RECTAL | Status: DC | PRN

## 2020-06-29 MED ORDER — HEPARIN (PORCINE) 25000 UT/250ML-% IV SOLN
2900.0000 [IU]/h | INTRAVENOUS | Status: DC
  Administered 2020-06-29: 1300 [IU]/h via INTRAVENOUS
  Administered 2020-06-30: 03:00:00 1700 [IU]/h via INTRAVENOUS
  Administered 2020-06-30: 13:00:00 2000 [IU]/h via INTRAVENOUS
  Administered 2020-07-01: 10:00:00 2700 [IU]/h via INTRAVENOUS
  Administered 2020-07-01: 3100 [IU]/h via INTRAVENOUS
  Administered 2020-07-01: 2250 [IU]/h via INTRAVENOUS
  Administered 2020-07-02: 3100 [IU]/h via INTRAVENOUS
  Filled 2020-06-29 (×7): qty 250

## 2020-06-29 MED ORDER — HEPARIN BOLUS VIA INFUSION
4000.0000 [IU] | Freq: Once | INTRAVENOUS | Status: AC
  Administered 2020-06-29: 4000 [IU] via INTRAVENOUS

## 2020-06-29 MED ORDER — ALBUTEROL SULFATE (2.5 MG/3ML) 0.083% IN NEBU: 2.5000 mg | INHALATION_SOLUTION | Freq: Four times a day (QID) | RESPIRATORY_TRACT | Status: DC

## 2020-06-29 MED ORDER — HYDRALAZINE HCL 20 MG/ML IJ SOLN
10.0000 mg | Freq: Four times a day (QID) | INTRAMUSCULAR | Status: DC | PRN
Start: 2020-06-29 — End: 2020-07-02
  Administered 2020-06-29 (×2): 10 mg via INTRAVENOUS
  Filled 2020-06-29 (×2): qty 1

## 2020-06-29 MED ORDER — ACETAMINOPHEN 325 MG PO TABS
650.0000 mg | ORAL_TABLET | Freq: Four times a day (QID) | ORAL | Status: DC | PRN
  Administered 2020-06-29: 650 mg via ORAL
  Filled 2020-06-29: qty 2

## 2020-06-29 NOTE — ED Notes (Addendum)
Spoke with Md Dahal, orders to start ngt gtt for pt's pressure. Goal systolic 160

## 2020-06-29 NOTE — ED Notes (Signed)
Date and time results received: 06/29/20 1847 (use smartphrase ".now" to insert current time)  Test: trop Critical Value: 4907  Name of Provider Notified: Dahal

## 2020-06-29 NOTE — ED Triage Notes (Signed)
EMS called out for chest pain. Pt tested positive for covid on Tuesday. Pt not vaccinated. Pt had nitro x 3 and 324mg  ASA in route, which brought cp down to 4/10.

## 2020-06-29 NOTE — ED Notes (Signed)
Date and time results received: 06/29/20 1512 Test: trop Critical Value: 2903  Name of Provider Notified: Dahal

## 2020-06-29 NOTE — Progress Notes (Signed)
ANTICOAGULATION CONSULT NOTE - Initial Consult  Pharmacy Consult for heparin gtt  Indication: chest pain/ACS  No Known Allergies  Patient Measurements: Height: 6\' 2"  (188 cm) Weight: (!) 172.4 kg (380 lb) IBW/kg (Calculated) : 82.2 Heparin Dosing Weight: HEPARIN DW (KG): 123.6   Vital Signs: Temp: 98.4 F (36.9 C) (01/08 1112) Temp Source: Oral (01/08 1112) BP: 178/109 (01/08 1230) Pulse Rate: 75 (01/08 1230)  Labs: Recent Labs    06/29/20 0706  HGB 15.4  HCT 48.3  PLT 287  CREATININE 0.89    Estimated Creatinine Clearance: 177.2 mL/min (by C-G formula based on SCr of 0.89 mg/dL).   Medical History: Past Medical History:  Diagnosis Date  . Heart murmur   . Hypertension     Medications:  (Not in a hospital admission)  Scheduled:  . albuterol  2.5 mg Nebulization Q6H  . heparin  4,000 Units Intravenous Once   Infusions:  . heparin    . nitroGLYCERIN     PRN: acetaminophen **OR** acetaminophen, hydrALAZINE, morphine injection, oxyCODONE Anti-infectives (From admission, onward)   None      Assessment: Travis Beltran a 45 y.o. male requires anticoagulation with a heparin iv infusion for the indication of  chest pain/ACS. Heparin gtt will be started following pharmacy protocol per pharmacy consult. Patient is not on previous oral anticoagulant that will require aPTT/HL correlation before transitioning to only HL monitoring.   Goal of Therapy:  Heparin level 0.3-0.7 units/ml Monitor platelets by anticoagulation protocol: Yes   Plan:  Give 4000 units bolus x 1 Start heparin infusion at 1300 units/hr Check anti-Xa level in 6 hours and daily while on heparin Continue to monitor H&H and platelets   Heparin level to be drawn in 6 hours  .59 Travis Beltran 06/29/2020,1:05 PM

## 2020-06-29 NOTE — ED Provider Notes (Signed)
[facility] Provider Note   CSN: [id number] Arrival date & time: 06/29/20  [id number]     History Chief Complaint  Patient presents with  . Chest Pain    Covid +    Travis SmilingDouglas L Beltran is a 45 y.o. male.  Pt presents to the ED today with cp.  He said it was a sharp pain that occurred when he woke up.  He has not had any sob.  He tested positive for Covid on 1/4.  Sx started on 1/1.  He had had a cough.  Pt denies any current fevers.  He was not vaccinated.  He called EMS who gave him 324 mg asa and nitro times 3.  CP has improved.  Pt denies any cp now.  Pt denies any hx of CAD.  He has a hx of htn and is supposed to be on bp meds, but has not seen a pcp in a long time.  He does smoke.  No family hx of CAD.        Past Medical History:  Diagnosis Date  . Heart murmur   . Hypertension     Patient Active Problem List   Diagnosis Date Noted  . NSTEMI (non-ST elevated myocardial infarction) (HCC) 06/29/2020    History reviewed. No pertinent surgical history.     Family History  Problem Relation Age of Onset  . Cancer Father   . Hypertension Father   . Hypertension Mother     Social History   Tobacco Use  . Smoking status: Current Every Day Smoker    Packs/day: 0.50    Types: Cigarettes  . Smokeless tobacco: Never Used  Substance Use Topics  . Alcohol use: Not Currently    Comment: occ  . Drug use: No    Home Medications Prior to Admission medications   Medication Sig Start Date End Date Taking? Authorizing Provider  albuterol (PROVENTIL HFA;VENTOLIN HFA) 108 (90 Base) MCG/ACT inhaler Inhale 1-2 puffs into the lungs every 6 (six) hours as needed for wheezing. 05/25/17   Rolland PorterJames, Mark, MD  amoxicillin (AMOXIL) 500 MG capsule Take 2 capsules (1,000 mg total) by mouth 2 (two) times daily. 07/04/19   Dione BoozeGlick, David, MD  fluticasone Aleda Grana(FLONASE) 50 MCG/ACT nasal spray 1 spray each nares twice a day 06/18/15   Rolland PorterJames, Mark, MD    Allergies    Patient has  no known allergies.  Review of Systems   Review of Systems  Cardiovascular: Positive for chest pain.  All other systems reviewed and are negative.   Physical Exam Updated Vital Signs BP (!) 178/109   Pulse 75   Temp 98.4 F (36.9 C) (Oral)   Resp 14   Ht 6\' 2"  (1.88 m)   Wt (!) 172.4 kg   SpO2 95%   BMI 48.79 kg/m   Physical Exam Vitals and nursing note reviewed.  Constitutional:      Appearance: He is well-developed. He is obese.  HENT:     Head: Normocephalic and atraumatic.  Eyes:     Extraocular Movements: Extraocular movements intact.     Pupils: Pupils are equal, round, and reactive to light.  Cardiovascular:     Rate and Rhythm: Normal rate and regular rhythm.     Heart sounds: Normal heart sounds.  Pulmonary:     Effort: Pulmonary effort is normal.     Breath sounds: Normal breath sounds.  Abdominal:     General: Bowel sounds are normal.     Palpations:  Abdomen is soft.  Musculoskeletal:        General: Normal range of motion.     Cervical back: Normal range of motion and neck supple.  Skin:    General: Skin is warm.     Capillary Refill: Capillary refill takes less than 2 seconds.  Neurological:     General: No focal deficit present.     Mental Status: He is alert and oriented to person, place, and time.     ED Results / Procedures / Treatments   Labs (all labs ordered are listed, but only abnormal results are displayed) Labs Reviewed  COMPREHENSIVE METABOLIC PANEL - Abnormal; Notable for the following components:      Result Value   Glucose, Bld 106 (*)    All other components within normal limits  URINALYSIS, ROUTINE W REFLEX MICROSCOPIC - Abnormal; Notable for the following components:   Color, Urine AMBER (*)    APPearance HAZY (*)    Protein, ur 100 (*)    Bacteria, UA RARE (*)    All other components within normal limits  LIPID PANEL - Abnormal; Notable for the following components:   HDL 30 (*)    All other components within normal  limits  CBG MONITORING, ED - Abnormal; Notable for the following components:   Glucose-Capillary 100 (*)    All other components within normal limits  TROPONIN I (HIGH SENSITIVITY) - Abnormal; Notable for the following components:   Troponin I (High Sensitivity) 101 (*)    All other components within normal limits  TROPONIN I (HIGH SENSITIVITY) - Abnormal; Notable for the following components:   Troponin I (High Sensitivity) 1,413 (*)    All other components within normal limits  CBC  TSH  HEMOGLOBIN A1C  HEPARIN LEVEL (UNFRACTIONATED)  TROPONIN I (HIGH SENSITIVITY)    EKG EKG Interpretation  Date/Time:  Saturday June 29 2020 06:57:15 EST Ventricular Rate:  93 PR Interval:    QRS Duration: 103 QT Interval:  352 QTC Calculation: 438 R Axis:   58 Text Interpretation: Sinus rhythm Borderline prolonged PR interval Borderline T wave abnormalities No significant change since last tracing Confirmed by Jacalyn Lefevre [phone]) on 06/29/2020 7:20:55 AM   Radiology CT Angio Chest PE W and/or Wo Contrast  Result Date: 06/29/2020 CLINICAL DATA:  45 year old male with suspected pulmonary embolism, presenting with chest pain after recent positive COVID test. EXAM: CT ANGIOGRAPHY CHEST WITH CONTRAST TECHNIQUE: Multidetector CT imaging of the chest was performed using the standard protocol during bolus administration of intravenous contrast. Multiplanar CT image reconstructions and MIPs were obtained to evaluate the vascular anatomy. CONTRAST:  100 mL Omnipaque 350, intravenous COMPARISON:  None. FINDINGS: Cardiovascular: Satisfactory opacification of the pulmonary arteries to the segmental level. No evidence of pulmonary embolism. Mild global cardiomegaly. No pericardial effusion. Mediastinum/Nodes: No enlarged mediastinal, hilar, or axillary lymph nodes. Thyroid gland, trachea, and esophagus demonstrate no significant findings. Lungs/Pleura: No focal consolidations. 5 mm pleural based solid  pulmonary nodule in the posterior aspect of the anterior segment of the upper lobe. Mild lingular and left basilar subsegmental atelectasis. No pleural effusion or pneumothorax. Upper Abdomen: Diffusely decreased attenuation of the hepatic parenchyma. The visualized upper abdomen is otherwise within normal limits. Musculoskeletal: No acute fracture or aggressive appearing osseous lesion. Asymmetric right gynecomastia. Review of the MIP images confirms the above findings. IMPRESSION: Vascular: No evidence of acute pulmonary embolism.  Mild global cardiomegaly. Non-Vascular: 1. Asymmetric right gynecomastia. 2. Hepatic steatosis. 3. No evidence of pneumonia. Marliss Coots,  MD [facility] Radiology Electronically Signed   By: Marliss Coots MD   On: 06/29/2020 10:33   DG Chest Portable 1 View  Result Date: 06/29/2020 CLINICAL DATA:  Chest pain.  COVID positive EXAM: PORTABLE CHEST 1 VIEW COMPARISON:  05/25/2017 FINDINGS: No convincing infiltrate. Generous heart size but accentuated by technique and low volume chest. Extensive artifact from EKG leads. No edema, effusion, or air leak. IMPRESSION: Negative low volume chest. Electronically Signed   By: Marnee Spring M.D.   On: 06/29/2020 07:33    Procedures Procedures (including critical care time)  Medications Ordered in ED Medications  nitroGLYCERIN 50 mg in dextrose 5 % 250 mL (0.2 mg/mL) infusion (has no administration in time range)  acetaminophen (TYLENOL) tablet 650 mg (has no administration in time range)    Or  acetaminophen (TYLENOL) suppository 650 mg (has no administration in time range)  albuterol (PROVENTIL) (2.5 MG/3ML) 0.083% nebulizer solution 2.5 mg (has no administration in time range)  hydrALAZINE (APRESOLINE) injection 10 mg (has no administration in time range)  oxyCODONE (Oxy IR/ROXICODONE) immediate release tablet 5 mg (has no administration in time range)  morphine 2 MG/ML injection  2 mg (has no administration in time range)  heparin bolus via infusion 4,000 Units (has no administration in time range)  heparin ADULT infusion 100 units/mL (25000 units/212mL) (has no administration in time range)  iohexol (OMNIPAQUE) 350 MG/ML injection 100 mL (100 mLs Intravenous Contrast Given 06/29/20 1004)  amLODipine (NORVASC) tablet 5 mg (5 mg Oral Given 06/29/20 1235)    ED Course  I have reviewed the triage vital signs and the nursing notes.  Pertinent labs & imaging results that were available during my care of the patient were reviewed by me and considered in my medical decision making (see chart for details).    MDM Rules/Calculators/A&P                          Initial troponin 101.  2nd trop 1413.  Pt's EKG shows no evidence of STEMI.  No additional cp while here.  Pt d/w Dr. Duke Salvia (cards) who thinks he can stay here.  Pt d/w Dr. Pola Corn (triad) who will admit.  He requests a heparin and nitro drip as well as an ECHO.  I ordered those things.  CRITICAL CARE Performed by: Jacalyn Lefevre   Total critical care time: 30 minutes  Critical care time was exclusive of separately billable procedures and treating other patients.  Critical care was necessary to treat or prevent imminent or life-threatening deterioration.  Critical care was time spent personally by me on the following activities: development of treatment plan with patient and/or surrogate as well as nursing, discussions with consultants, evaluation of patient's response to treatment, examination of patient, obtaining history from patient or surrogate, ordering and performing treatments and interventions, ordering and review of laboratory studies, ordering and review of radiographic studies, pulse oximetry and re-evaluation of patient's condition.  Travis Beltran was evaluated in Emergency Department on 06/29/2020 for the symptoms described in the history of present illness. He was evaluated in the context of the  global COVID-19 pandemic, which necessitated consideration that the patient might be at risk for infection with the SARS-CoV-2 virus that causes COVID-19. Institutional protocols and algorithms that pertain to the evaluation of patients at risk for COVID-19 are in a state of rapid change based on information released by regulatory bodies including the CDC and federal  and state organizations. These policies and algorithms were followed during the patient's care in the ED. Final Clinical Impression(s) / ED Diagnoses Final diagnoses:  COVID-19  Primary hypertension  NSTEMI (non-ST elevated myocardial infarction) [facility])    Rx / DC Orders ED Discharge Orders    None       Jacalyn Lefevre, MD 06/29/20 1308

## 2020-06-29 NOTE — ED Notes (Signed)
Date and time results received: 06/29/20 1210  Test: trop Critical Value: 1413  Name of Provider Notified: Particia Nearing

## 2020-06-29 NOTE — ED Notes (Signed)
Date and time results received: 06/29/20 0833 (use smartphrase ".now" to insert current time)  Test: Troponin Critical Value: 101  Name of Provider Notified: Dr Particia Nearing Orders Received? Or Actions Taken?: NA

## 2020-06-29 NOTE — Progress Notes (Addendum)
ANTICOAGULATION CONSULT NOTE -   Pharmacy Consult for heparin gtt  Indication: chest pain/ACS  No Known Allergies  Patient Measurements: Height: 6\' 2"  (188 cm) Weight: (!) 172.4 kg (380 lb) IBW/kg (Calculated) : 82.2  HEPARIN DW (KG): 123.6  Vital Signs: Temp: 98.1 F (36.7 C) (01/08 1909) Temp Source: Oral (01/08 1909) BP: 142/91 (01/08 2000) Pulse Rate: 92 (01/08 2000)  Labs: Recent Labs    06/29/20 0706 06/29/20 1900  HGB 15.4  --   HCT 48.3  --   PLT 287  --   HEPARINUNFRC  --  <0.10*  CREATININE 0.89  --     Estimated Creatinine Clearance: 177.2 mL/min (by C-G formula based on SCr of 0.89 mg/dL).   Medical History: Past Medical History:  Diagnosis Date  . Heart murmur   . Hypertension     Medications:  (Not in a hospital admission)  Scheduled:   Infusions:  . heparin 1,300 Units/hr (06/29/20 1341)  . nitroGLYCERIN 25 mcg/min (06/29/20 1537)   PRN: acetaminophen **OR** acetaminophen, hydrALAZINE, morphine injection, oxyCODONE Anti-infectives (From admission, onward)   None      Assessment: Travis Beltran a 45 y.o. male requires anticoagulation with a heparin iv infusion for the indication of  chest pain/ACS. Heparin gtt will be started following pharmacy protocol per pharmacy consult. Patient is not on previous oral anticoagulant.   HL < 0.1, subtherapeutic. No issues with IV site per RN  Goal of Therapy:  Heparin level 0.3-0.7 units/ml Monitor platelets by anticoagulation protocol: Yes   Plan:  Give 3500 units bolus x 1 Increase heparin infusion at 1700 units/hr Check anti-Xa level in 6 hours and daily while on heparin Continue to monitor H&H and platelets   59, BS Elder Cyphers, BCPS Clinical Pharmacist Pager [phone] 06/29/2020,8:11 PM

## 2020-06-29 NOTE — H&P (Addendum)
[facility] History and Physical  CHRISTOPHOR EICK AVW:[id number] DOB: 1976-03-18 DOA: 06/29/2020 PCP: Roma Kayser, MD  Admitted from: Home Chief Complaint: Chest pain  History of Present Illness: Travis Beltran is a 45 y.o. male hypertension. Patient was diagnosed with Covid 19 on 1/4 in the ED when he presented for few days of cough, shortness of breath.  He was not hypoxic at the time and was discharged home. This morning, patient woke up with sharp chest pain.  It did not resolve.  EMS was called who gave him aspirin 324 mg and nitro sublingual 3 times with subsequent improvement in chest pain. He has no history of coronary disease.  He has history of hypertension but not taking meds.  He has not seen PCP in a long time. Patient smokes around 1-1/2 pack/day.  Not vaccinated against Covid.  In the ED, patient was afebrile, blood pressure elevated to 180s.   Initial troponin was 101 with subsequent rise to 1400.   EKG without significant ST-T wave changes ED physician called cardiologist on-call Dr. Duke Salvia.  In light of improving symptoms, negative EKG findings and no inpatient bed available at [facility] hospital, Dr. Duke Salvia recommended admission here at [facility]. Hospitalist service was consulted for inpatient admission and management. I recommended the ED physician to start the patient on heparin drip for NSTEMI and nitroglycerin drip for hypertensive urgency. Troponin subsequently up to 2900.  At the time of my evaluation, patient was lying on bed.  Chest pain resolved.  He feels anxious and shaky though.  Denies regular alcohol use.  Denies any drug use.  Review of Systems:  All systems were reviewed and were negative unless otherwise mentioned in the HPI   Past medical history: Past Medical History:  Diagnosis Date  . Heart murmur   . Hypertension     Past surgical history: History reviewed. No pertinent surgical history.  Social History:  reports  that he has been smoking cigarettes. He has been smoking about 0.50 packs per day. He has never used smokeless tobacco. He reports previous alcohol use. He reports that he does not use drugs.  Allergies:  No Known Allergies  Family history:  Family History  Problem Relation Age of Onset  . Cancer Father   . Hypertension Father   . Hypertension Mother      Home Meds: Prior to Admission medications   Medication Sig Start Date End Date Taking? Authorizing Provider  guaiFENesin (MUCINEX) 600 MG 12 hr tablet Take 600 mg by mouth 2 (two) times daily.   Yes [provider]  albuterol (PROVENTIL HFA;VENTOLIN HFA) 108 (90 Base) MCG/ACT inhaler Inhale 1-2 puffs into the lungs every 6 (six) hours as needed for wheezing. Patient not taking: No sig reported 05/25/17   Rolland Porter, MD  amoxicillin (AMOXIL) 500 MG capsule Take 2 capsules (1,000 mg total) by mouth 2 (two) times daily. Patient not taking: No sig reported 07/04/19   Dione Booze, MD  fluticasone [facility]) 50 MCG/ACT nasal spray 1 spray each nares twice a day Patient not taking: No sig reported 06/18/15   Rolland Porter, MD    Physical Exam: Vitals:   06/29/20 1526 06/29/20 1530 06/29/20 1536 06/29/20 1545  BP: (!) 174/110 (!) 180/98 (!) 163/97 (!) 152/110  Pulse: (!) 102 98 95   Resp: (!) 24 (!) 27 (!) 23   Temp:  98.2 F (36.8 C)    TempSrc:  Oral    SpO2:  94% 94% 95%  Weight:      Height:       Wt Readings from Last 3 Encounters:  06/29/20 (!) 172.4 kg  06/25/20 (!) 175.1 kg  07/04/19 (!) 161.5 kg   Body mass index is 48.79 kg/m.  General exam: Morbidly obese young African-American male. Skin: No rashes, lesions or ulcers. HEENT: Atraumatic, normocephalic, no obvious bleeding Lungs: Clear to auscultation bilaterally CVS: Regular rate and rhythm, no murmur GI/Abd soft, nontender, nondistended, bowel sound present CNS: Alert, awake, oriented x3 Psychiatry: Mood appropriate Extremities: Trace bilateral pedal  pedal edema, no calf tenderness     Consult Orders  (From admission, onward)         Start     Ordered   06/29/20 1217  Consult to hospitalist  ALL PATIENTS BEING ADMITTED/HAVING PROCEDURES NEED COVID-19 SCREENING  Once       Comments: ALL PATIENTS BEING ADMITTED/HAVING PROCEDURES NEED COVID-19 SCREENING  Provider:  (Not yet assigned)  Question Answer Comment  Place call to: [facility]   Reason for Consult Admit      06/29/20 1216          Labs on Admission:   CBC: Recent Labs  Lab 06/29/20 0706  WBC 6.3  HGB 15.4  HCT 48.3  MCV 88.5  PLT 287    Basic Metabolic Panel: Recent Labs  Lab 06/29/20 0706  NA 139  K 4.0  CL 105  CO2 26  GLUCOSE 106*  BUN 8  CREATININE 0.89  CALCIUM 8.9    Liver Function Tests: Recent Labs  Lab 06/29/20 0706  AST 23  ALT 20  ALKPHOS 81  BILITOT 0.6  PROT 6.9  ALBUMIN 3.6   No results for input(s): LIPASE, AMYLASE in the last 168 hours. No results for input(s): AMMONIA in the last 168 hours.  Cardiac Enzymes: No results for input(s): CKTOTAL, CKMB, CKMBINDEX, TROPONINI in the last 168 hours.  BNP (last 3 results) No results for input(s): BNP in the last 8760 hours.  ProBNP (last 3 results) No results for input(s): PROBNP in the last 8760 hours.  CBG: Recent Labs  Lab 06/29/20 0735  GLUCAP 100*    Lipase  No results found for: LIPASE   Urinalysis    Component Value Date/Time   COLORURINE AMBER (A) 06/29/2020 0706   APPEARANCEUR HAZY (A) 06/29/2020 0706   LABSPEC 1.026 06/29/2020 0706   PHURINE 5.0 06/29/2020 0706   GLUCOSEU NEGATIVE 06/29/2020 0706   HGBUR NEGATIVE 06/29/2020 0706   BILIRUBINUR NEGATIVE 06/29/2020 0706   KETONESUR NEGATIVE 06/29/2020 0706   PROTEINUR 100 (A) 06/29/2020 0706   NITRITE NEGATIVE 06/29/2020 0706   LEUKOCYTESUR NEGATIVE 06/29/2020 0706     Drugs of Abuse  No results found for: LABOPIA, COCAINSCRNUR, LABBENZ, AMPHETMU, THCU, LABBARB    Radiological Exams on  Admission: CT Angio Chest PE W and/or Wo Contrast  Result Date: 06/29/2020 CLINICAL DATA:  45 year old male with suspected pulmonary embolism, presenting with chest pain after recent positive COVID test. EXAM: CT ANGIOGRAPHY CHEST WITH CONTRAST TECHNIQUE: Multidetector CT imaging of the chest was performed using the standard protocol during bolus administration of intravenous contrast. Multiplanar CT image reconstructions and MIPs were obtained to evaluate the vascular anatomy. CONTRAST:  100 mL Omnipaque 350, intravenous COMPARISON:  None. FINDINGS: Cardiovascular: Satisfactory opacification of the pulmonary arteries to the segmental level. No evidence of pulmonary embolism. Mild global cardiomegaly. No pericardial effusion. Mediastinum/Nodes: No enlarged mediastinal, hilar, or axillary lymph nodes. Thyroid gland, trachea, and esophagus demonstrate no significant  findings. Lungs/Pleura: No focal consolidations. 5 mm pleural based solid pulmonary nodule in the posterior aspect of the anterior segment of the upper lobe. Mild lingular and left basilar subsegmental atelectasis. No pleural effusion or pneumothorax. Upper Abdomen: Diffusely decreased attenuation of the hepatic parenchyma. The visualized upper abdomen is otherwise within normal limits. Musculoskeletal: No acute fracture or aggressive appearing osseous lesion. Asymmetric right gynecomastia. Review of the MIP images confirms the above findings. IMPRESSION: Vascular: No evidence of acute pulmonary embolism.  Mild global cardiomegaly. Non-Vascular: 1. Asymmetric right gynecomastia. 2. Hepatic steatosis. 3. No evidence of pneumonia. Marliss Coots, MD [facility] Electronically Signed   By: Marliss Coots MD   On: 06/29/2020 10:33   DG Chest Portable 1 View  Result Date: 06/29/2020 CLINICAL DATA:  Chest pain.  COVID positive EXAM: PORTABLE CHEST 1 VIEW COMPARISON:  05/25/2017 FINDINGS: No convincing  infiltrate. Generous heart size but accentuated by technique and low volume chest. Extensive artifact from EKG leads. No edema, effusion, or air leak. IMPRESSION: Negative low volume chest. Electronically Signed   By: Marnee Spring M.D.   On: 06/29/2020 07:33     ------------------------------------------------------------------------------------------------------ Assessment/Plan: Active Problems:   NSTEMI (non-ST elevated myocardial infarction) (HCC)  NSTEMI -Presented with chest pain resolved with aspirin and nitroglycerin. -No EKG changes but troponin trending up. -No history of coronary disease but has significant risk factors include morbid obesity and uncontrolled blood pressure. -Elevated troponin could be due to ischemic heart disease or Covid related myocarditis. -I discussed the case with cardiologist Dr. Duke Salvia again myself after the troponin of 2900.  Per Dr. Duke Salvia, at this time, patient does not need emergency cardiac cath. -I will start the patient on heparin drip and nitroglycerin drip.  Echo not available at this time today.  Expect tomorrow.  Patient may subsequently require cardiac cath or more emergently if troponin continues to rise up.  For that reason, I would admit the patient to [facility].  For now he remains in ED and will be transferred when bed is available. Recent Labs    06/29/20 0706 06/29/20 0906 06/29/20 1347  TROPONINIHS 101* 1,413* 2,903*   Hypertensive urgency -Blood pressure elevated to more than 180s. -Started on nitroglycerin drip -Continue to monitor.  Covid infection -Covid PCR positive since 1/4. -No evidence of pneumonia on chest x-ray.  Not requiring supplemental oxygen.  Morbid obesity - Body mass index is 48.79 kg/m. Patient has been advised to make an attempt to improve diet and exercise patterns to aid in weight loss.  Mobility: Encourage ambulation Code Status:   Code Status: Full Code  DVT prophylaxis:  Heparin  drip Antimicrobials:  None Fluid: None  Diet:  Diet Order            Diet Heart Room service appropriate? Yes; Fluid consistency: Thin  Diet effective now                 Consultants: Cardiologist on the phone Family Communication:  Tried to reach patient's wife and given numbers.  Mailbox full.  Dispo: The patient is from: Home              Anticipated d/c is to: Home              Anticipated d/c date is: 3 days  ------------------------------------------------------------------------------------- Severity of Illness: The appropriate patient status for this patient is INPATIENT. Inpatient status is judged to be reasonable and necessary in order to provide the required intensity  of service to ensure the patient's safety. The patient's presenting symptoms, physical exam findings, and initial radiographic and laboratory data in the context of their chronic comorbidities is felt to place them at high risk for further clinical deterioration. Furthermore, it is not anticipated that the patient will be medically stable for discharge from the hospital within 2 midnights of admission. The following factors support the patient status of inpatient.   " The patient's presenting symptoms include chest pain. " The worrisome physical exam findings include elevated blood pressure. " The initial radiographic and laboratory data are worrisome because of elevated troponin. " The chronic co-morbidities include uncontrolled blood pressure, morbid obesity.   * I certify that at the point of admission it is my clinical judgment that the patient will require inpatient hospital care spanning beyond 2 midnights from the point of admission due to high intensity of service, high risk for further deterioration and high frequency of surveillance required.*  Signed, Lorin Glass, MD [facility] 06/29/2020

## 2020-06-30 ENCOUNTER — Inpatient Hospital Stay (HOSPITAL_COMMUNITY): Payer: 59

## 2020-06-30 DIAGNOSIS — R079 Chest pain, unspecified: Secondary | ICD-10-CM

## 2020-06-30 DIAGNOSIS — U071 COVID-19: Secondary | ICD-10-CM

## 2020-06-30 DIAGNOSIS — I10 Essential (primary) hypertension: Secondary | ICD-10-CM

## 2020-06-30 LAB — BASIC METABOLIC PANEL
Anion gap: 7 (ref 5–15)
BUN: 8 mg/dL (ref 6–20)
CO2: 25 mmol/L (ref 22–32)
Calcium: 8.7 mg/dL — ABNORMAL LOW (ref 8.9–10.3)
Chloride: 105 mmol/L (ref 98–111)
Creatinine, Ser: 0.81 mg/dL (ref 0.61–1.24)
GFR, Estimated: >60 mL/min (ref >60)
Glucose, Bld: 104 mg/dL — ABNORMAL HIGH (ref 70–99)
Potassium: 3.6 mmol/L (ref 3.5–5.1)
Sodium: 137 mmol/L (ref 135–145)

## 2020-06-30 LAB — ECHOCARDIOGRAM COMPLETE
Area-P 1/2: 5.58 cm2
Height: 74 in
S' Lateral: 2.98 cm
Weight: 6080 oz

## 2020-06-30 LAB — HIV ANTIBODY (ROUTINE TESTING W REFLEX): HIV Screen 4th Generation wRfx: NONREACTIVE

## 2020-06-30 LAB — HEPARIN LEVEL (UNFRACTIONATED)
Heparin Unfractionated: <0.1 IU/mL — ABNORMAL LOW (ref 0.30–0.70)
Heparin Unfractionated: 0.22 IU/mL — ABNORMAL LOW (ref 0.30–0.70)

## 2020-06-30 LAB — TROPONIN I (HIGH SENSITIVITY): Troponin I (High Sensitivity): 8270 ng/L (ref <18)

## 2020-06-30 MED ORDER — ATORVASTATIN CALCIUM 40 MG PO TABS
40.0000 mg | ORAL_TABLET | Freq: Every day | ORAL | Status: DC
  Administered 2020-06-30 – 2020-07-02 (×3): 40 mg via ORAL
  Filled 2020-06-30 (×3): qty 1

## 2020-06-30 MED ORDER — HEPARIN BOLUS VIA INFUSION
2000.0000 [IU] | Freq: Once | INTRAVENOUS | Status: AC
  Administered 2020-06-30: 2000 [IU] via INTRAVENOUS

## 2020-06-30 MED ORDER — PANTOPRAZOLE SODIUM 40 MG PO TBEC
40.0000 mg | DELAYED_RELEASE_TABLET | Freq: Every day | ORAL | Status: DC
  Administered 2020-06-30 – 2020-07-02 (×3): 40 mg via ORAL
  Filled 2020-06-30 (×3): qty 1

## 2020-06-30 MED ORDER — HEPARIN BOLUS VIA INFUSION
3500.0000 [IU] | Freq: Once | INTRAVENOUS | Status: AC
  Administered 2020-06-30: 3500 [IU] via INTRAVENOUS

## 2020-06-30 MED ORDER — CARVEDILOL 12.5 MG PO TABS
12.5000 mg | ORAL_TABLET | Freq: Two times a day (BID) | ORAL | Status: DC
  Administered 2020-06-30 (×2): 12.5 mg via ORAL
  Filled 2020-06-30 (×2): qty 1

## 2020-06-30 MED ORDER — ASPIRIN EC 81 MG PO TBEC
81.0000 mg | DELAYED_RELEASE_TABLET | Freq: Every day | ORAL | Status: DC
  Administered 2020-06-30 – 2020-07-01 (×2): 81 mg via ORAL
  Filled 2020-06-30 (×2): qty 1

## 2020-06-30 NOTE — Progress Notes (Signed)
PROGRESS NOTE    Travis Beltran  ZOX:[id number] DOB: 02-05-1976 DOA: 06/29/2020 PCP: Roma KayserNida, Gebreselassie W, MD   Chief Complaint  Patient presents with  . Chest Pain    Covid +    Brief Narrative:  Travis Beltran is a 45 y.o. male hypertension. Patient was diagnosed with Covid 19 on 1/4 in the ED when he presented for few days of cough, shortness of breath.  He was not hypoxic at the time and was discharged home. This morning, patient woke up with sharp chest pain.  It did not resolve.  EMS was called who gave him aspirin 324 mg and nitro sublingual 3 times with subsequent improvement in chest pain. He has no history of coronary disease.  He has history of hypertension but not taking meds.  He has not seen PCP in a long time. Patient smokes around 1-1/2 pack/day.  Not vaccinated against Covid.  In the ED, patient was afebrile, blood pressure elevated to 180s.   Initial troponin was 101 with subsequent rise to 1400.   EKG without significant ST-T wave changes ED physician called cardiologist on-call Dr. Duke Salviaandolph.  In light of improving symptoms, negative EKG findings and no inpatient bed available [facility], Dr. Duke Salviaandolph recommended admission here at [facility]. Hospitalist service was consulted for inpatient admission and management. I recommended the ED physician to start the patient on heparin drip for NSTEMI and nitroglycerin drip for hypertensive urgency. Troponin subsequently up to 2900.  At the time of my evaluation, patient was lying on bed.  Chest pain resolved.  He feels anxious and shaky though.  Denies regular alcohol use.  Denies any drug use.   Assessment & Plan: 1-chest pain/NSTEMI -Troponin peaked at 8,270 -2D echo demonstrating no wall motion abnormalities and preserved ejection fraction; no significant valvular disorder. -Currently chest pain-free -Complete 48 hours of heparin drip and nitroglycerin drip -Continue the use of statins, aspirin and  carvedilol. -Follow cardiology service recommendations.  2-hypertension -Uncontrolled on admission -Has stabilized with the use of nitroglycerin drip and carvedilol. -Heart healthy diet recommended.  3-morbid obesity -Body mass index is 48.79 kg/m. -Low calorie diet, portion control increase physical activity discussed with patient treatment.  4-GERD/GI prophylaxis -started on PPI  5-COVID-19 infection -No complaining of shortness of breath -Good oxygen saturation on room air -No significant normalities appreciated on chest x-ray -Continue supportive care. -at this moment no qualifying for steroids or remdesevir. -Patient is not vaccinated.  6-tobacco abuse -Cessation counseling -Patient has declined nicotine patch.   DVT prophylaxis: Heparin drip. Code Status: Full code Family Communication: No family at bedside. Disposition:   Status is: Inpatient  Dispo: The patient is from: Home              Anticipated d/c is to: Home              Anticipated d/c date is: 1-2 days              Patient currently no medically stable for discharge; currently chest pain-free and not complaining of shortness of breath at rest.  Good oxygen saturation on room air.  Continue medical management for NSTEMI with heparin drip, nitroglycerin drip, ASA, statins and Coreg.  Cardiology has been consulted and no emergent cath anticipated.    Consultants:   Cardiology service   Procedures:  See below for x-ray reports 2D echo: 1. Left ventricular ejection fraction, by estimation, is 65 to 70%. The  left ventricle has normal function. The  left ventricle has no regional  wall motion abnormalities. There is moderate left ventricular hypertrophy.  Left ventricular diastolic  parameters are indeterminate.  2. Right ventricular systolic function is normal. The right ventricular  size is normal.  3. The mitral valve is normal in structure. Trivial mitral valve  regurgitation.  4. The  aortic valve is normal in structure. Aortic valve regurgitation is  not visualized.  5. The inferior vena cava is normal in size with greater than 50%  respiratory variability, suggesting right atrial pressure of 3 mmHg.    Antimicrobials:  None   Subjective: Afebrile, currently chest pain-free, no nausea, no vomiting.  Good oxygen saturation on room air.  Objective: Vitals:   06/30/20 1230 06/30/20 1300 06/30/20 1330 06/30/20 1430  BP: (!) 155/95 (!) 155/98 (!) 146/112 (!) 163/89  Pulse: 90 85 93 88  Resp: (!) 21 (!) 25 (!) 22 16  Temp: 99 F (37.2 C)     TempSrc: Oral     SpO2: 94% 96% 92% 92%  Weight:      Height:       No intake or output data in the 24 hours ending 06/30/20 1531 Filed Weights   06/29/20 0659  Weight: (!) 172.4 kg    Examination:  General exam: Appears calm and in no major distress currently; patient is denying chest pain, shortness of breath and palpitation.  Currently afebrile. Respiratory system: Good air movement bilaterally; no wheezing, no crackles noted no using accessory muscle.  Good O2 sat on room air. Cardiovascular system: S1 & S2 heard, RRR. No JVD, murmurs, rubs, gallops or clicks.  Gastrointestinal system: Abdomen is obese, nondistended, soft and nontender. No organomegaly or masses felt. Normal bowel sounds heard. Central nervous system: Alert and oriented. No focal neurological deficits. Extremities: No cyanosis or clubbing. Skin: No rashes, no petechiae Psychiatry: Judgement and insight appear normal. Mood & affect appropriate.     Data Reviewed: I have personally reviewed following labs and imaging studies  CBC: Recent Labs  Lab 06/29/20 0706  WBC 6.3  HGB 15.4  HCT 48.3  MCV 88.5  PLT 287    Basic Metabolic Panel: Recent Labs  Lab 06/29/20 0706 06/30/20 0504  NA 139 137  K 4.0 3.6  CL 105 105  CO2 26 25  GLUCOSE 106* 104*  BUN 8 8  CREATININE 0.89 0.81  CALCIUM 8.9 8.7*    GFR: Estimated Creatinine  Clearance: 194.7 mL/min (by C-G formula based on SCr of 0.81 mg/dL).  Liver Function Tests: Recent Labs  Lab 06/29/20 0706  AST 23  ALT 20  ALKPHOS 81  BILITOT 0.6  PROT 6.9  ALBUMIN 3.6    CBG: Recent Labs  Lab 06/29/20 0735  GLUCAP 100*     No results found for this or any previous visit (from the past 240 hour(s)).    Radiology Studies: CT Angio Chest PE W and/or Wo Contrast  Result Date: 06/29/2020 CLINICAL DATA:  45 year old male with suspected pulmonary embolism, presenting with chest pain after recent positive COVID test. EXAM: CT ANGIOGRAPHY CHEST WITH CONTRAST TECHNIQUE: Multidetector CT imaging of the chest was performed using the standard protocol during bolus administration of intravenous contrast. Multiplanar CT image reconstructions and MIPs were obtained to evaluate the vascular anatomy. CONTRAST:  100 mL Omnipaque 350, intravenous COMPARISON:  None. FINDINGS: Cardiovascular: Satisfactory opacification of the pulmonary arteries to the segmental level. No evidence of pulmonary embolism. Mild global cardiomegaly. No pericardial effusion. Mediastinum/Nodes: No enlarged mediastinal, hilar, or  axillary lymph nodes. Thyroid gland, trachea, and esophagus demonstrate no significant findings. Lungs/Pleura: No focal consolidations. 5 mm pleural based solid pulmonary nodule in the posterior aspect of the anterior segment of the upper lobe. Mild lingular and left basilar subsegmental atelectasis. No pleural effusion or pneumothorax. Upper Abdomen: Diffusely decreased attenuation of the hepatic parenchyma. The visualized upper abdomen is otherwise within normal limits. Musculoskeletal: No acute fracture or aggressive appearing osseous lesion. Asymmetric right gynecomastia. Review of the MIP images confirms the above findings. IMPRESSION: Vascular: No evidence of acute pulmonary embolism.  Mild global cardiomegaly. Non-Vascular: 1. Asymmetric right gynecomastia. 2. Hepatic steatosis. 3.  No evidence of pneumonia. Marliss Coots, MD [facility] Electronically Signed   By: Marliss Coots MD   On: 06/29/2020 10:33   DG Chest Portable 1 View  Result Date: 06/29/2020 CLINICAL DATA:  Chest pain.  COVID positive EXAM: PORTABLE CHEST 1 VIEW COMPARISON:  05/25/2017 FINDINGS: No convincing infiltrate. Generous heart size but accentuated by technique and low volume chest. Extensive artifact from EKG leads. No edema, effusion, or air leak. IMPRESSION: Negative low volume chest. Electronically Signed   By: Marnee Spring M.D.   On: 06/29/2020 07:33   ECHOCARDIOGRAM COMPLETE  Result Date: 06/30/2020    ECHOCARDIOGRAM REPORT   Patient Name:   EDWARD GUTHMILLER Date of Exam: 06/30/2020 Medical Rec #:  [id number]          Height:       74.0 in Accession #:    [id number]         Weight:       380.0 lb Date of Birth:  [birth year]         BSA:          2.855 m Patient Age:    44 years           BP:           143/93 mmHg Patient Gender: M                  HR:           82 bpm. [facility]am [facility] Procedure: 2D Echo Indications:    Chest Pain R07.9  History:        Patient has no prior history of Echocardiogram examinations.                 Previous Myocardial Infarction, Signs/Symptoms:Murmur; Risk                 Factors:Hypertension and Current Smoker. Covid 19.  Sonographer:    Jeryl Columbia RDCS (AE) Referring Phys: [id number] [facility] IMPRESSIONS  1. Left ventricular ejection fraction, by estimation, is 65 to 70%. The left ventricle has normal function. The left ventricle has no regional wall motion abnormalities. There is moderate left ventricular hypertrophy. Left ventricular diastolic parameters are indeterminate.  2. Right ventricular systolic function is normal. The right ventricular size is normal.  3. The mitral valve is normal in structure. Trivial mitral valve regurgitation.  4. The aortic valve is normal in structure. Aortic valve  regurgitation is not visualized.  5. The inferior vena cava is normal in size with greater than 50% respiratory variability, suggesting right atrial pressure of 3 mmHg. FINDINGS  Left Ventricle: Left ventricular ejection fraction, by estimation, is 65 to 70%. The left ventricle has normal function. The left ventricle has no regional wall motion abnormalities. The left ventricular internal cavity size was normal  in size. There is  moderate left ventricular hypertrophy. Left ventricular diastolic parameters are indeterminate. Right Ventricle: The right ventricular size is normal. Right vetricular wall thickness was not assessed. Right ventricular systolic function is normal. Left Atrium: Left atrial size was normal in size. Right Atrium: Right atrial size was normal in size. Pericardium: There is no evidence of pericardial effusion. Mitral Valve: The mitral valve is normal in structure. Trivial mitral valve regurgitation. Tricuspid Valve: The tricuspid valve is normal in structure. Tricuspid valve regurgitation is trivial. Aortic Valve: The aortic valve is normal in structure. Aortic valve regurgitation is not visualized. Pulmonic Valve: The pulmonic valve was normal in structure. Pulmonic valve regurgitation is not visualized. Aorta: The aortic root is normal in size and structure. Venous: The inferior vena cava is normal in size with greater than 50% respiratory variability, suggesting right atrial pressure of 3 mmHg. IAS/Shunts: The interatrial septum was not assessed.  LEFT VENTRICLE PLAX 2D LVIDd:         4.93 cm  Diastology LVIDs:         2.98 cm  LV e' medial:    11.20 cm/s LV PW:         1.58 cm  LV E/e' medial:  5.2 LV IVS:        1.43 cm  LV e' lateral:   6.85 cm/s LVOT diam:     2.00 cm  LV E/e' lateral: 8.5 LVOT Area:     3.14 cm  RIGHT VENTRICLE RV S prime:     18.20 cm/s TAPSE (M-mode): 2.9 cm LEFT ATRIUM             Index       RIGHT ATRIUM           Index LA diam:        3.80 cm 1.33 cm/m  RA Area:      17.10 cm LA Vol (A2C):   74.8 ml 26.20 ml/m RA Volume:   50.60 ml  17.72 ml/m LA Vol (A4C):   62.2 ml 21.79 ml/m LA Biplane Vol: 69.5 ml 24.34 ml/m   AORTA Ao Root diam: 3.10 cm MITRAL VALVE MV Area (PHT): 5.58 cm    SHUNTS MV Decel Time: 136 msec    Systemic Diam: 2.00 cm MV E velocity: 58.00 cm/s MV A velocity: 72.20 cm/s MV E/A ratio:  0.80 Dietrich Pates MD Electronically signed by Dietrich Pates MD Signature Date/Time: 06/30/2020/2:01:17 PM    Final     Scheduled Meds: . aspirin EC  81 mg Oral Daily  . atorvastatin  40 mg Oral Daily  . carvedilol  12.5 mg Oral BID WC   Continuous Infusions: . heparin 2,000 Units/hr (06/30/20 1242)  . nitroGLYCERIN 45 mcg/min (06/30/20 1243)     LOS: 1 day    Time spent: 35 minutes   Vassie Loll, MD [facility]   To contact the attending provider between 7A-7P or the covering provider during after hours 7P-7A, please log into the web site [email or web address] and access using universal [place] password for that web site. If you do not have the password, please call the hospital operator.  06/30/2020, 3:31 PM

## 2020-06-30 NOTE — Progress Notes (Signed)
ANTICOAGULATION CONSULT NOTE -   Pharmacy Consult for heparin gtt  Indication: chest pain/ACS  No Known Allergies  Patient Measurements: Height: 6\' 2"  (188 cm) Weight: (!) 172.4 kg (380 lb) IBW/kg (Calculated) : 82.2  HEPARIN DW (KG): 123.6  Vital Signs: BP: 143/105 (01/09 0830) Pulse Rate: 109 (01/09 0830)  Labs: Recent Labs    06/29/20 0706 06/29/20 1900 06/30/20 0504  HGB 15.4  --   --   HCT 48.3  --   --   PLT 287  --   --   HEPARINUNFRC  --  <0.10* <0.10*  CREATININE 0.89  --  0.81    Estimated Creatinine Clearance: 194.7 mL/min (by C-G formula based on SCr of 0.81 mg/dL).   Medical History: Past Medical History:  Diagnosis Date  . Heart murmur   . Hypertension     Medications:  (Not in a hospital admission)  Scheduled:  . aspirin EC  81 mg Oral Daily  . atorvastatin  40 mg Oral Daily  . carvedilol  12.5 mg Oral BID WC  . heparin  3,500 Units Intravenous Once   Infusions:  . heparin 1,700 Units/hr (06/30/20 0239)  . nitroGLYCERIN 45 mcg/min (06/29/20 2348)   PRN: acetaminophen **OR** acetaminophen, hydrALAZINE, morphine injection, oxyCODONE Anti-infectives (From admission, onward)   None      Assessment: Travis Beltran a 45 y.o. male requires anticoagulation with a heparin iv infusion for the indication of  chest pain/ACS. Heparin gtt will be started following pharmacy protocol per pharmacy consult. Patient is not on previous oral anticoagulant.   HL < 0.1, subtherapeutic. No issues with IV site per RN  Goal of Therapy:  Heparin level 0.3-0.7 units/ml Monitor platelets by anticoagulation protocol: Yes   Plan:  Give 3500 units bolus x 1 Increase heparin infusion at 2000 units/hr Check anti-Xa level in 6 hours and daily while on heparin Continue to monitor H&H and platelets   59, PharmD, MBA, BCGP Clinical Pharmacist  06/30/2020,9:23 AM

## 2020-06-30 NOTE — ED Notes (Signed)
Second rn confirmed concern for infiltration, area of infiltration aprox quarter sized, denies pain in the area, no redness noted, d/c iv, cath intact, infusions are currently in R ac, denies pain at if site, no swelling or redness noted, pt on phone with his wife, offers no complaints.

## 2020-06-30 NOTE — Progress Notes (Signed)
ANTICOAGULATION CONSULT NOTE -   Pharmacy Consult for heparin Indication: chest pain/ACS  No Known Allergies  Patient Measurements: Height: 6\' 2"  (188 cm) Weight: (!) 172.4 kg (380 lb) IBW/kg (Calculated) : 82.2  HEPARIN DW (KG): 123.6  Vital Signs: Temp: 98.4 F (36.9 C) (01/09 1659) Temp Source: Oral (01/09 1659) BP: 148/96 (01/09 1659) Pulse Rate: 95 (01/09 1659)  Labs: Recent Labs    06/29/20 0706 06/29/20 1900 06/30/20 0504 06/30/20 1630  HGB 15.4  --   --   --   HCT 48.3  --   --   --   PLT 287  --   --   --   HEPARINUNFRC  --  <0.10* <0.10* 0.22*  CREATININE 0.89  --  0.81  --     Estimated Creatinine Clearance: 194.7 mL/min (by C-G formula based on SCr of 0.81 mg/dL).   Medical History: Past Medical History:  Diagnosis Date  . Heart murmur   . Hypertension     Medications:  (Not in a hospital admission)  Scheduled:  . aspirin EC  81 mg Oral Daily  . atorvastatin  40 mg Oral Daily  . carvedilol  12.5 mg Oral BID WC  . pantoprazole  40 mg Oral Daily   Infusions:  . heparin 2,000 Units/hr (06/30/20 1242)  . nitroGLYCERIN 45 mcg/min (06/30/20 1243)   PRN: acetaminophen **OR** acetaminophen, hydrALAZINE, morphine injection, oxyCODONE Anti-infectives (From admission, onward)   None      Assessment: Travis Beltran a 45 y.o. male requires anticoagulation with a heparin iv infusion for the indication of  chest pain/ACS. Heparin gtt will be started following pharmacy protocol per pharmacy consult. Patient is not on previous oral anticoagulant.   HL <= 0.22, subtherapeutic. No issues with IV site per RN  Goal of Therapy:  Heparin level 0.3-0.7 units/ml Monitor platelets by anticoagulation protocol: Yes   Plan:  Give 2000 units bolus x 1 Increase heparin infusion at 2250 units/hr Check anti-Xa level in 6 hours and daily while on heparin Continue to monitor H&H and platelets   59, BS Elder Cyphers, BCPS Clinical Pharmacist Pager  [phone] 06/30/2020,5:45 PM

## 2020-06-30 NOTE — ED Notes (Signed)
Pt in bed, pt c/o pain at iv site, checked iv, flush without pain or swelling, no redness noted, switched infusions to pt's other iv site in his R ac, that it flush and has positive blood return without pain or swelling, re assessed R forearm iv, flush without pain, possible quarter sized area of infiltration noted, called pharmacy and they said nothing to be done for heparin or ntg infiltration.  Iv to be evaluated by second RN.

## 2020-06-30 NOTE — Progress Notes (Signed)
*  PRELIMINARY RESULTS* Echocardiogram 2D Echocardiogram has been performed.  Jeryl Columbia 06/30/2020, 8:33 AM

## 2020-07-01 DIAGNOSIS — Z8249 Family history of ischemic heart disease and other diseases of the circulatory system: Secondary | ICD-10-CM

## 2020-07-01 DIAGNOSIS — Z720 Tobacco use: Secondary | ICD-10-CM

## 2020-07-01 DIAGNOSIS — E669 Obesity, unspecified: Secondary | ICD-10-CM

## 2020-07-01 LAB — CBC
HCT: 41.8 % (ref 39.0–52.0)
Hemoglobin: 13.5 g/dL (ref 13.0–17.0)
MCH: 28.1 pg (ref 26.0–34.0)
MCHC: 32.3 g/dL (ref 30.0–36.0)
MCV: 87.1 fL (ref 80.0–100.0)
Platelets: 280 10*3/uL (ref 150–400)
RBC: 4.8 MIL/uL (ref 4.22–5.81)
RDW: 13.6 % (ref 11.5–15.5)
WBC: 8.8 10*3/uL (ref 4.0–10.5)
nRBC: 0 % (ref 0.0–0.2)

## 2020-07-01 LAB — BASIC METABOLIC PANEL
Anion gap: 9 (ref 5–15)
BUN: 10 mg/dL (ref 6–20)
CO2: 22 mmol/L (ref 22–32)
Calcium: 8.5 mg/dL — ABNORMAL LOW (ref 8.9–10.3)
Chloride: 105 mmol/L (ref 98–111)
Creatinine, Ser: 0.78 mg/dL (ref 0.61–1.24)
GFR, Estimated: >60 mL/min (ref >60)
Glucose, Bld: 96 mg/dL (ref 70–99)
Potassium: 3.8 mmol/L (ref 3.5–5.1)
Sodium: 136 mmol/L (ref 135–145)

## 2020-07-01 LAB — HEPARIN LEVEL (UNFRACTIONATED)
Heparin Unfractionated: 0.14 IU/mL — ABNORMAL LOW (ref 0.30–0.70)
Heparin Unfractionated: 0.36 IU/mL (ref 0.30–0.70)
Heparin Unfractionated: 0.23 IU/mL — ABNORMAL LOW (ref 0.30–0.70)
Heparin Unfractionated: 0.17 IU/mL — ABNORMAL LOW (ref 0.30–0.70)

## 2020-07-01 LAB — TROPONIN I (HIGH SENSITIVITY)
Troponin I (High Sensitivity): 2350 ng/L (ref <18)
Troponin I (High Sensitivity): 2023 ng/L (ref <18)

## 2020-07-01 MED ORDER — CARVEDILOL 25 MG PO TABS
25.0000 mg | ORAL_TABLET | Freq: Two times a day (BID) | ORAL | Status: DC
  Administered 2020-07-01 – 2020-07-02 (×3): 25 mg via ORAL
  Filled 2020-07-01: qty 1
  Filled 2020-07-01: qty 2
  Filled 2020-07-01: qty 1

## 2020-07-01 MED ORDER — AMLODIPINE BESYLATE 5 MG PO TABS
5.0000 mg | ORAL_TABLET | Freq: Every day | ORAL | Status: DC
  Administered 2020-07-01 – 2020-07-02 (×2): 5 mg via ORAL
  Filled 2020-07-01 (×2): qty 1

## 2020-07-01 MED ORDER — HEPARIN BOLUS VIA INFUSION
3000.0000 [IU] | Freq: Once | INTRAVENOUS | Status: AC
  Administered 2020-07-01: 3000 [IU] via INTRAVENOUS

## 2020-07-01 MED ORDER — HEPARIN BOLUS VIA INFUSION
3000.0000 [IU] | Freq: Once | INTRAVENOUS | Status: AC
  Administered 2020-07-01: 3000 [IU] via INTRAVENOUS
  Filled 2020-07-01: qty 3000

## 2020-07-01 NOTE — ED Notes (Signed)
Date and time results received: 07/01/20 1159 (use smartphrase ".now" to insert current time)  Test: troponin  Critical Value: 2023  Name of Provider Notified: [place]  Orders Received? Or Actions Taken?:

## 2020-07-01 NOTE — Consult Note (Addendum)
Cardiology Consultation:   Patient ID: Travis Beltran MRN: [id number]; DOB: 05-07-1976  Admit date: 06/29/2020 Date of Consult: 07/01/2020  Primary Care Provider: Cassandria Anger, MD [facility] Cardiologist: Carlyle Dolly, MD new [facility] Electrophysiologist:  None     Patient Profile:   Travis Beltran is a 45 y.o. male with a hx of HTN who is being seen today for the evaluation of NSTEMI at the request of Dr. Dyann Kief.  History of Present Illness:   Travis Beltran with history of uncontrolled HTN , morbid obesity,tobacco abuse was Covid19 positive 06/25/20. He returned to the ED yesterday with chest pain and uncontrolled BP. Troponin peaked 8270, EKG without acute change and Echo normal LVEF 65-70%,  no WMA or pericardial effusion.  Spoke with patient via phone. Patient says Saturday he woke up sweaty had a real sharp pain like a knife lasted 40 min and eased spontaneously before EMS arrived. No pain since. Has had HTN for over a year but didn't have insurance until now so wasn't seen. Smokes 1 1/2 ppd. Father died MI 26's. No regular exercise. Denies chest pressure, dyspnea, dizziness or presyncope.   Past Medical History:  Diagnosis Date  . Heart murmur   . Hypertension     History reviewed. No pertinent surgical history.   Home Medications:  Prior to Admission medications   Medication Sig Start Date End Date Taking? Authorizing Provider  guaiFENesin (MUCINEX) 600 MG 12 hr tablet Take 600 mg by mouth 2 (two) times daily.   Yes [provider]  albuterol (PROVENTIL HFA;VENTOLIN HFA) 108 (90 Base) MCG/ACT inhaler Inhale 1-2 puffs into the lungs every 6 (six) hours as needed for wheezing. Patient not taking: No sig reported 05/25/17   Tanna Furry, MD  amoxicillin (AMOXIL) 500 MG capsule Take 2 capsules (1,000 mg total) by mouth 2 (two) times daily. Patient not taking: No sig reported 8/92/11   Delora Fuel, MD  fluticasone [facility]) 50 MCG/ACT nasal  spray 1 spray each nares twice a day Patient not taking: No sig reported 06/18/15   Tanna Furry, MD    Inpatient Medications: Scheduled Meds: . aspirin EC  81 mg Oral Daily  . atorvastatin  40 mg Oral Daily  . carvedilol  25 mg Oral BID WC  . pantoprazole  40 mg Oral Daily   Continuous Infusions: . heparin 2,700 Units/hr (07/01/20 0259)  . nitroGLYCERIN 45 mcg/min (06/30/20 1243)   PRN Meds: acetaminophen **OR** acetaminophen, hydrALAZINE, morphine injection, oxyCODONE  Allergies:   No Known Allergies  Social History:   Social History   Socioeconomic History  . Marital status: Married    Spouse name: Not on file  . Number of children: Not on file  . Years of education: Not on file  . Highest education level: Not on file  Occupational History  . Not on file  Tobacco Use  . Smoking status: Current Every Day Smoker    Packs/day: 0.50    Types: Cigarettes  . Smokeless tobacco: Never Used  Substance and Sexual Activity  . Alcohol use: Not Currently    Comment: occ  . Drug use: No  . Sexual activity: Not on file  Other Topics Concern  . Not on file  Social History Narrative  . Not on file   Social Determinants of Health   Financial Resource Strain: Not on file  Food Insecurity: Not on file  Transportation Needs: Not on file  Physical Activity: Not on file  Stress: Not on file  Social Connections: Not on file  Intimate Partner Violence: Not on file    Family History:    Family History  Problem Relation Age of Onset  . Cancer Father   . Hypertension Father   . Hypertension Mother      ROS:  Please see the history of present illness.  Review of Systems  Constitutional: Positive for weight gain.  HENT: Negative.   Cardiovascular: Positive for chest pain.  Respiratory: Negative.   Endocrine: Negative.   Hematologic/Lymphatic: Negative.   Musculoskeletal: Negative.   Gastrointestinal: Negative.   Genitourinary: Negative.   Neurological: Negative.      All other ROS reviewed and negative.     Physical Exam/Data:   Vitals:   07/01/20 0600 07/01/20 0630 07/01/20 0700 07/01/20 0810  BP: (!) 157/101 (!) 178/100 (!) 144/108 (!) 154/111  Pulse: 93 82 92 94  Resp: 19 15 (!) 21 17  Temp:      TempSrc:      SpO2: 94% 92% 96% 93%  Weight:      Height:       No intake or output data in the 24 hours ending 07/01/20 0827 Last 3 Weights 06/29/2020 06/25/2020 07/04/2019  Weight (lbs) 380 lb 386 lb 356 lb  Weight (kg) 172.367 kg 175.088 kg 161.481 kg     Body mass index is 48.79 kg/m.   Spoke with patient over phone.   EKG:  The EKG was personally reviewed and demonstrates: NSR with poor R wave progression anteriorly and TWI anteriorly Telemetry:  Telemetry was personally reviewed and demonstrates:  NSR  Relevant CV Studies: Echo 06/30/20 IMPRESSIONS     1. Left ventricular ejection fraction, by estimation, is 65 to 70%. The  left ventricle has normal function. The left ventricle has no regional  wall motion abnormalities. There is moderate left ventricular hypertrophy.  Left ventricular diastolic  parameters are indeterminate.   2. Right ventricular systolic function is normal. The right ventricular  size is normal.   3. The mitral valve is normal in structure. Trivial mitral valve  regurgitation.   4. The aortic valve is normal in structure. Aortic valve regurgitation is  not visualized.   5. The inferior vena cava is normal in size with greater than 50%  respiratory variability, suggesting right atrial pressure of 3 mmHg.   FINDINGS   Left Ventricle: Left ventricular ejection fraction, by estimation, is 65  to 70%. The left ventricle has normal function. The left ventricle has no  regional wall motion abnormalities. The left ventricular internal cavity  size was normal in size. There is   moderate left ventricular hypertrophy. Left ventricular diastolic  parameters are indeterminate.   Right Ventricle: The right ventricular  size is normal. Right vetricular  wall thickness was not assessed. Right ventricular systolic function is  normal.   Left Atrium: Left atrial size was normal in size.   Right Atrium: Right atrial size was normal in size.   Pericardium: There is no evidence of pericardial effusion.   Mitral Valve: The mitral valve is normal in structure. Trivial mitral  valve regurgitation.   Tricuspid Valve: The tricuspid valve is normal in structure. Tricuspid  valve regurgitation is trivial.   Aortic Valve: The aortic valve is normal in structure. Aortic valve  regurgitation is not visualized.   Pulmonic Valve: The pulmonic valve was normal in structure. Pulmonic valve  regurgitation is not visualized.   Aorta: The aortic root is normal in size and structure.   Venous:  The inferior vena cava is normal in size with greater than 50%  respiratory variability, suggesting right atrial pressure of 3 mmHg.   IAS/Shunts: The interatrial septum was not assessed.    Laboratory Data:  High Sensitivity Troponin:   Recent Labs  Lab 06/29/20 0706 06/29/20 0906 06/29/20 1347 06/29/20 1736 06/30/20 0504  TROPONINIHS 101* 1,413* 2,903* 4,970* 8,270*     Chemistry Recent Labs  Lab 06/29/20 0706 06/30/20 0504  NA 139 137  K 4.0 3.6  CL 105 105  CO2 26 25  GLUCOSE 106* 104*  BUN 8 8  CREATININE 0.89 0.81  CALCIUM 8.9 8.7*  GFRNONAA >60 >60  ANIONGAP 8 7    Recent Labs  Lab 06/29/20 0706  PROT 6.9  ALBUMIN 3.6  AST 23  ALT 20  ALKPHOS 81  BILITOT 0.6   Hematology Recent Labs  Lab 06/29/20 0706  WBC 6.3  RBC 5.46  HGB 15.4  HCT 48.3  MCV 88.5  MCH 28.2  MCHC 31.9  RDW 13.3  PLT 287   BNPNo results for input(s): BNP, PROBNP in the last 168 hours.  DDimer No results for input(s): DDIMER in the last 168 hours.   Radiology/Studies:  CT Angio Chest PE W and/or Wo Contrast  Result Date: 06/29/2020 CLINICAL DATA:  45 year old male with suspected pulmonary embolism,  presenting with chest pain after recent positive COVID test. EXAM: CT ANGIOGRAPHY CHEST WITH CONTRAST TECHNIQUE: Multidetector CT imaging of the chest was performed using the standard protocol during bolus administration of intravenous contrast. Multiplanar CT image reconstructions and MIPs were obtained to evaluate the vascular anatomy. CONTRAST:  100 mL Omnipaque 350, intravenous COMPARISON:  None. FINDINGS: Cardiovascular: Satisfactory opacification of the pulmonary arteries to the segmental level. No evidence of pulmonary embolism. Mild global cardiomegaly. No pericardial effusion. Mediastinum/Nodes: No enlarged mediastinal, hilar, or axillary lymph nodes. Thyroid gland, trachea, and esophagus demonstrate no significant findings. Lungs/Pleura: No focal consolidations. 5 mm pleural based solid pulmonary nodule in the posterior aspect of the anterior segment of the upper lobe. Mild lingular and left basilar subsegmental atelectasis. No pleural effusion or pneumothorax. Upper Abdomen: Diffusely decreased attenuation of the hepatic parenchyma. The visualized upper abdomen is otherwise within normal limits. Musculoskeletal: No acute fracture or aggressive appearing osseous lesion. Asymmetric right gynecomastia. Review of the MIP images confirms the above findings. IMPRESSION: Vascular: No evidence of acute pulmonary embolism.  Mild global cardiomegaly. Non-Vascular: 1. Asymmetric right gynecomastia. 2. Hepatic steatosis. 3. No evidence of pneumonia. Ruthann Cancer, MD [facility] Electronically Signed   By: Ruthann Cancer MD   On: 06/29/2020 10:33   DG Chest Portable 1 View  Result Date: 06/29/2020 CLINICAL DATA:  Chest pain.  COVID positive EXAM: PORTABLE CHEST 1 VIEW COMPARISON:  05/25/2017 FINDINGS: No convincing infiltrate. Generous heart size but accentuated by technique and low volume chest. Extensive artifact from EKG leads. No edema, effusion, or air  leak. IMPRESSION: Negative low volume chest. Electronically Signed   By: Monte Fantasia M.D.   On: 06/29/2020 07:33   ECHOCARDIOGRAM COMPLETE  Result Date: 06/30/2020    ECHOCARDIOGRAM REPORT   Patient Name:   Travis Beltran Date of Exam: 06/30/2020 Medical Rec #:  [id number]          Height:       74.0 in Accession #:    [id number]         Weight:       380.0 lb Date of Birth:  March 30, 1976         BSA:          2.855 m Patient Age:    24 years           BP:           143/93 mmHg Patient Gender: M                  HR:           82 bpm. Exam Location:  [facility] Procedure: 2D Echo Indications:    Chest Pain R07.9  History:        Patient has no prior history of Echocardiogram examinations.                 Previous Myocardial Infarction, Signs/Symptoms:Murmur; Risk                 Factors:Hypertension and Current Smoker. Covid 19.  Sonographer:    Leavy Cella RDCS (AE) Referring Phys: [id number] [place]  1. Left ventricular ejection fraction, by estimation, is 65 to 70%. The left ventricle has normal function. The left ventricle has no regional wall motion abnormalities. There is moderate left ventricular hypertrophy. Left ventricular diastolic parameters are indeterminate.  2. Right ventricular systolic function is normal. The right ventricular size is normal.  3. The mitral valve is normal in structure. Trivial mitral valve regurgitation.  4. The aortic valve is normal in structure. Aortic valve regurgitation is not visualized.  5. The inferior vena cava is normal in size with greater than 50% respiratory variability, suggesting right atrial pressure of 3 mmHg. FINDINGS  Left Ventricle: Left ventricular ejection fraction, by estimation, is 65 to 70%. The left ventricle has normal function. The left ventricle has no regional wall motion abnormalities. The left ventricular internal cavity size was normal in size. There is  moderate left ventricular hypertrophy. Left ventricular diastolic  parameters are indeterminate. Right Ventricle: The right ventricular size is normal. Right vetricular wall thickness was not assessed. Right ventricular systolic function is normal. Left Atrium: Left atrial size was normal in size. Right Atrium: Right atrial size was normal in size. Pericardium: There is no evidence of pericardial effusion. Mitral Valve: The mitral valve is normal in structure. Trivial mitral valve regurgitation. Tricuspid Valve: The tricuspid valve is normal in structure. Tricuspid valve regurgitation is trivial. Aortic Valve: The aortic valve is normal in structure. Aortic valve regurgitation is not visualized. Pulmonic Valve: The pulmonic valve was normal in structure. Pulmonic valve regurgitation is not visualized. Aorta: The aortic root is normal in size and structure. Venous: The inferior vena cava is normal in size with greater than 50% respiratory variability, suggesting right atrial pressure of 3 mmHg. IAS/Shunts: The interatrial septum was not assessed.  LEFT VENTRICLE PLAX 2D LVIDd:         4.93 cm  Diastology LVIDs:         2.98 cm  LV e' medial:    11.20 cm/s LV PW:         1.58 cm  LV E/e' medial:  5.2 LV IVS:        1.43 cm  LV e' lateral:   6.85 cm/s LVOT diam:     2.00 cm  LV E/e' lateral: 8.5 LVOT Area:     3.14 cm  RIGHT VENTRICLE RV S prime:     18.20 cm/s TAPSE (M-mode): 2.9 cm LEFT ATRIUM  Index       RIGHT ATRIUM           Index LA diam:        3.80 cm 1.33 cm/m  RA Area:     17.10 cm LA Vol (A2C):   74.8 ml 26.20 ml/m RA Volume:   50.60 ml  17.72 ml/m LA Vol (A4C):   62.2 ml 21.79 ml/m LA Biplane Vol: 69.5 ml 24.34 ml/m   AORTA Ao Root diam: 3.10 cm MITRAL VALVE MV Area (PHT): 5.58 cm    SHUNTS MV Decel Time: 136 msec    Systemic Diam: 2.00 cm MV E velocity: 58.00 cm/s MV A velocity: 72.20 cm/s MV E/A ratio:  0.80 Dorris Carnes MD Electronically signed by Dorris Carnes MD Signature Date/Time: 06/30/2020/2:01:17 PM    Final      Assessment and Plan:   [id number]  positive 06/25/20-asymptomatic  NSTEMI-chest pain-now pain free, peak troponins 8270, nonspecific EKG, echo with normal LVEF, no WMA or effusion. Started on IV heparin & NTG for 48 hrs, ASA, lipitor Coreg 25 bid Would control BP-add amlodipine.   Uncontrolled HTN 178/100, 144/108 this am will add amlodipine. Getting hydralazine 10 mg prn.   Tobacco abuse smoking cessation discussed  Obesity-exercise and weight loss recommended.   Family history of CAD-father died MI 68's      TIMI Risk Score for Unstable Angina or Non-ST Elevation MI:   The patient's TIMI risk score is 3, which indicates a 13% risk of all cause mortality, new or recurrent myocardial infarction or need for urgent revascularization in the next 14 days.           For questions or updates, please contact [place] Please consult [email or web address] for contact info under    Signed, Ermalinda Barrios, PA-C  07/01/2020 8:27 AM  Attending note  Patient seen and discussed with PA Bonnell Public, I agree with her documentation. 45 yo male history of HTN, tobacco abuse presented with cough, SOB on Jan 4 to ER. Diagnosed with COVID 19 at that time, discharged home as he did not meet criteria for admission. Presented again Jan 8 with sharp chest pain  Admission labs:  WBC 6.3 Hgb 15.4 Plt 287 K 4 Cr 0.89 TSH 1.055 Trop 101-->1413-->2903-->4970-->8270--> UDS neg EKG SR, V4/V5 ST depressions CXR no acute process CT PE: no PE, no evidence of pneumonia Echo: LVEF 65-70%, normal RV function, no WMAs   Patient presented with chst pain  EKG SR, lateral precordial ST depressions. Trop up to 8270 has not peaked. Started on hep gtt/Ng gtt,  Echo normal LVEF, no WMAs. EKG and troponins consistent with NSTEMI. Has been chest pain free since admission. Dififcultly transferring to [facility] due to bed availability, ultimately will need cath. He is 6 days out from +COVID test and asymptomatic. I spoke with cath lab coordinator given his covid  status and from discussion would be reasonable for cath given these factors. Spoke with medicine team who will arrange transfer, would plan for cath once able to be transferred, will go ahead and have him eat today and be npo tonight. Continue hep gtt, wean NG gtt. Continue coreg, asa, statin,consider ACEI/ARB if CAD is confirmed after cath.   COVID 19 + on Jan 4 with mild symptoms. At not time was he hypoxic, CT actually shows clear lungs field. Has been afebrile. Has not met criteria for steroids or remdesevir.    I have reviewed the risks, indications, and alternatives to cardiac catheterization, possible angioplasty,  and stenting with the patient today. Risks include but are not limited to bleeding, infection, vascular injury, stroke, myocardial infection, arrhythmia, kidney injury, radiation-related injury in the case of prolonged fluoroscopy use, emergency cardiac surgery, and death. The patient understands the risks of serious complication is 1-2 in 4712 with diagnostic cardiac cath and 1-2% or less with angioplasty/stenting.    Carlyle Dolly MD

## 2020-07-01 NOTE — ED Notes (Signed)
Dr.Branch at bedside with patient.

## 2020-07-01 NOTE — Progress Notes (Signed)
ANTICOAGULATION CONSULT NOTE - Follow Up Consult  Pharmacy Consult for heparin Indication: NSTEMI  Labs: Recent Labs    06/29/20 0706 06/29/20 0906 06/29/20 1347 06/29/20 1736 06/29/20 1900 06/30/20 0504 06/30/20 1630 07/01/20 0136  HGB 15.4  --   --   --   --   --   --   --   HCT 48.3  --   --   --   --   --   --   --   PLT 287  --   --   --   --   --   --   --   HEPARINUNFRC  --   --   --   --    < > <0.10* 0.22* 0.14*  CREATININE 0.89  --   --   --   --  0.81  --   --   TROPONINIHS 101*   < > 2,903* 4,970*  --  8,270*  --   --    < > = values in this interval not displayed.    Assessment: 45yo male subtherapeutic on heparin with lower heparin level despite increased rate; no gtt issues or signs of bleeding per RN.  Goal of Therapy:  Heparin level 0.3-0.7 units/ml   Plan:  Will rebolus with heparin 3000 units and increase heparin gtt by 3 units/kgABW/hr to 2700 units/hr and check level in 6 hours.    Vernard Gambles, PharmD, BCPS  07/01/2020,2:36 AM

## 2020-07-01 NOTE — ED Notes (Addendum)
Report given to RN @  4E.

## 2020-07-01 NOTE — Progress Notes (Signed)
ANTICOAGULATION CONSULT NOTE - Follow Up Consult  Pharmacy Consult for IV Heparin Indication: chest pain/ACS/ NSTEMI  No Known Allergies  Patient Measurements: Height: 6\' 2"  (188 cm) Weight: (!) 173.3 kg (382 lb) (stated per pt) IBW/kg (Calculated) : 82.2  HEPARIN DW (KG): 123.9 kg  Vital Signs: Temp: 98.7 F (37.1 C) (01/10 1557) Temp Source: Oral (01/10 1557) BP: 130/89 (01/10 1557) Pulse Rate: 84 (01/10 1557)  Labs: Recent Labs    06/29/20 0706 06/29/20 1900 06/30/20 0504 06/30/20 1630 07/01/20 0136 07/01/20 0838 07/01/20 1807  HGB 15.4  --   --   --   --  13.5  --   HCT 48.3  --   --   --   --  41.8  --   PLT 287  --   --   --   --  280  --   HEPARINUNFRC  --    < > <0.10*   < > 0.14* 0.36 0.17*  CREATININE 0.89  --  0.81  --   --  0.78  --    < > = values in this interval not displayed.    Estimated Creatinine Clearance: 197.7 mL/min (by C-G formula based on SCr of 0.78 mg/dL).   Medical History: Past Medical History:  Diagnosis Date  . Heart murmur   . Hypertension     Assessment: 45 y.o. male diagnosed with COVID on 1/4 in the ED presented to [facility] with chest pain and elevated troponins, dx'd with NSTEMI. Pharmacy was consulted to dose/monitor IV heparin. Pt was not on anticoagulant PTA. Pt was transferred to [facility] this afternoon in anticipation of cardiac cath on 07/03/19.  Pt had therapeutic heparin level (0.36 units/ml) on heparin infusion at 2700 units/hr earlier today; however, confirmatory heparin level drawn ~9.5 hrs later was 0.17 units/ml, which is below the goal range for this pt. H/H 13.5/41.8, plt 208. Per RN, no issues with IV or bleeding observed.  Goal of Therapy:  Heparin level 0.3-0.7 units/ml Monitor platelets by anticoagulation protocol: Yes   Plan:  Heparin 3000 units IV bolus X 1 Increase heparin infusion to 3100 units/hr Check heparin level in 6 hrs Monitor daily heparin level, CBC Monitor for  signs/symptoms of bleeding F/U cardiology evaluation plans   09-04-1976, PharmD, BCPS, [facility] Clinical Pharmacist 07/01/2020,7:09 PM

## 2020-07-01 NOTE — ED Notes (Signed)
Pt given new blood pressure cuff. bp is 130/91. Pt denies chest pain at this time. Pt is a/o x 4. Has no other complaints at this time.

## 2020-07-01 NOTE — Progress Notes (Signed)
PROGRESS NOTE    Travis Beltran  DTO:[id number] DOB: Jun 22, 1976 DOA: 06/29/2020 PCP: Roma Kayser, MD   Chief Complaint  Patient presents with  . Chest Pain    Covid +    Brief Narrative:  Travis Beltran is a 45 y.o. male hypertension. Patient was diagnosed with Covid 19 on 1/4 in the ED when he presented for few days of cough, shortness of breath.  He was not hypoxic at the time and was discharged home. This morning, patient woke up with sharp chest pain.  It did not resolve.  EMS was called who gave him aspirin 324 mg and nitro sublingual 3 times with subsequent improvement in chest pain. He has no history of coronary disease.  He has history of hypertension but not taking meds.  He has not seen PCP in a long time. Patient smokes around 1-1/2 pack/day.  Not vaccinated against Covid.  In the ED, patient was afebrile, blood pressure elevated to 180s.   Initial troponin was 101 with subsequent rise to 1400.   EKG without significant ST-T wave changes ED physician called cardiologist on-call Dr. Duke Salvia.  In light of improving symptoms, negative EKG findings and no inpatient bed available at [facility] hospital, Dr. Duke Salvia recommended admission here at [facility]. Hospitalist service was consulted for inpatient admission and management. I recommended the ED physician to start the patient on heparin drip for NSTEMI and nitroglycerin drip for hypertensive urgency. Troponin subsequently up to 2900.  At the time of my evaluation, patient was lying on bed.  Chest pain resolved.  He feels anxious and shaky though.  Denies regular alcohol use.  Denies any drug use.   Assessment & Plan: 1-chest pain/NSTEMI -Troponin peaked at 8,270; repeat troponin 2023. -2D echo demonstrating no wall motion abnormalities and preserved ejection fraction; no significant valvular disorder. -Patient has remained chest pain-free. -Status post 48 hours of heparin drip and nitroglycerin drip; per  cardiology recommendations continue heparin infusion and transferred to [facility] for anticipated cardiac cath evaluation on 07/02/2020. -Continue the use of statins, aspirin and beta-blocker. -Follow cardiology service recommendations.  2-hypertension -Uncontrolled on admission -Has stabilized on with the use of nitroglycerin drip and carvedilol -Amlodipine has been added by cardiology service; planning to also add ACE/ARB pending results of cardiac cath. -Heart healthy diet encouraged.  3-morbid obesity -Body mass index is 48.79 kg/m. -Low calorie diet, portion control increase physical activity discussed with patient treatment.  4-GERD/GI prophylaxis -started on PPI  5-COVID-19 infection -No complaining of shortness of breath -Good oxygen saturation on room air -No significant normalities appreciated on chest x-ray -Continue supportive care. -at this moment no qualifying for steroids or remdesevir therapy. -Patient is not vaccinated.  6-tobacco abuse -Cessation counseling -Patient has declined nicotine patch at this time..   DVT prophylaxis: Heparin drip. Code Status: Full code Family Communication: No family at bedside. Disposition:   Status is: Inpatient  Dispo: The patient is from: Home              Anticipated d/c is to: Home              Anticipated d/c date is: 1-2 days              Patient currently no medically stable for discharge; currently chest pain-free and not complaining of shortness of breath at rest.  Good oxygen saturation on room air.  Continue medical management for NSTEMI with heparin drip, nitroglycerin drip, ASA, statins and Coreg.  Cardiology has been consulted and no emergent cath anticipated.    Consultants:   Cardiology service   Procedures:  See below for x-ray reports 2D echo: 1. Left ventricular ejection fraction, by estimation, is 65 to 70%. The  left ventricle has normal function. The left ventricle has no regional   wall motion abnormalities. There is moderate left ventricular hypertrophy.  Left ventricular diastolic  parameters are indeterminate.  2. Right ventricular systolic function is normal. The right ventricular  size is normal.  3. The mitral valve is normal in structure. Trivial mitral valve  regurgitation.  4. The aortic valve is normal in structure. Aortic valve regurgitation is  not visualized.  5. The inferior vena cava is normal in size with greater than 50%  respiratory variability, suggesting right atrial pressure of 3 mmHg.    Antimicrobials:  None   Subjective: No fever, remains chest pain-free, no nausea, no vomiting, good saturation on room air.  Objective: Vitals:   07/01/20 1200 07/01/20 1230 07/01/20 1300 07/01/20 1330  BP: (!) 142/76 130/75 129/72 138/66  Pulse: 81 93 85 92  Resp: 19 (!) 21 15 (!) 21  Temp:      TempSrc:      SpO2: 98% 94% 96% 98%  Weight:      Height:        Intake/Output Summary (Last 24 hours) at 07/01/2020 1540 Last data filed at 07/01/2020 0959 Gross per 24 hour  Intake 485.72 ml  Output -  Net 485.72 ml   Filed Weights   06/29/20 0659  Weight: (!) 172.4 kg    Examination: General exam: Alert, awake, oriented x 3; in no major distress.  Reports no chest pain or shortness of breath.  Feeling frustrated by long stay in the emergency department. Respiratory system: Clear to auscultation. Respiratory effort normal.  No using accessory muscles. Cardiovascular system:RRR. No murmurs, rubs, gallops.  Unable to assess JVD with body habitus. Gastrointestinal system: Abdomen is obese, nondistended, soft and nontender. No organomegaly or masses felt. Normal bowel sounds heard. Central nervous system: Alert and oriented. No focal neurological deficits. Extremities: No cyanosis or clubbing. Skin: No rashes, no petechiae. Psychiatry: Judgement and insight appear normal. Mood & affect appropriate.    Data Reviewed: I have personally  reviewed following labs and imaging studies  CBC: Recent Labs  Lab 06/29/20 0706 07/01/20 0838  WBC 6.3 8.8  HGB 15.4 13.5  HCT 48.3 41.8  MCV 88.5 87.1  PLT 287 280    Basic Metabolic Panel: Recent Labs  Lab 06/29/20 0706 06/30/20 0504 07/01/20 0838  NA 139 137 136  K 4.0 3.6 3.8  CL 105 105 105  CO2 26 25 22   GLUCOSE 106* 104* 96  BUN 8 8 10   CREATININE 0.89 0.81 0.78  CALCIUM 8.9 8.7* 8.5*    GFR: Estimated Creatinine Clearance: 197.2 mL/min (by C-G formula based on SCr of 0.78 mg/dL).  Liver Function Tests: Recent Labs  Lab 06/29/20 0706  AST 23  ALT 20  ALKPHOS 81  BILITOT 0.6  PROT 6.9  ALBUMIN 3.6    CBG: Recent Labs  Lab 06/29/20 0735  GLUCAP 100*     No results found for this or any previous visit (from the past 240 hour(s)).    Radiology Studies: ECHOCARDIOGRAM COMPLETE  Result Date: 06/30/2020    ECHOCARDIOGRAM REPORT   Patient Name:   Travis Beltran Date of Exam: 06/30/2020 Medical Rec #:  Roslyn Smiling  Height:       74.0 in Accession #:    [id number]         Weight:       380.0 lb Date of Birth:  05/02/1976         BSA:          2.855 m Patient Age:    44 years           BP:           143/93 mmHg Patient Gender: M                  HR:           82 bpm. [facility] Procedure: 2D Echo Indications:    Chest Pain R07.9  History:        Patient has no prior history of Echocardiogram examinations.                 Previous Myocardial Infarction, Signs/Symptoms:Murmur; Risk                 Factors:Hypertension and Current Smoker. Covid 19.  Sonographer:    Jeryl ColumbiaJohanna Elliott RDCS (AE) Referring Phys: [id number] [facility] Center[facility]A DAHAL IMPRESSIONS  1. Left ventricular ejection fraction, by estimation, is 65 to 70%. The left ventricle has normal function. The left ventricle has no regional wall motion abnormalities. There is moderate left ventricular hypertrophy. Left ventricular diastolic parameters are indeterminate.  2. Right ventricular systolic  function is normal. The right ventricular size is normal.  3. The mitral valve is normal in structure. Trivial mitral valve regurgitation.  4. The aortic valve is normal in structure. Aortic valve regurgitation is not visualized.  5. The inferior vena cava is normal in size with greater than 50% respiratory variability, suggesting right atrial pressure of 3 mmHg. FINDINGS  Left Ventricle: Left ventricular ejection fraction, by estimation, is 65 to 70%. The left ventricle has normal function. The left ventricle has no regional wall motion abnormalities. The left ventricular internal cavity size was normal in size. There is  moderate left ventricular hypertrophy. Left ventricular diastolic parameters are indeterminate. Right Ventricle: The right ventricular size is normal. Right vetricular wall thickness was not assessed. Right ventricular systolic function is normal. Left Atrium: Left atrial size was normal in size. Right Atrium: Right atrial size was normal in size. Pericardium: There is no evidence of pericardial effusion. Mitral Valve: The mitral valve is normal in structure. Trivial mitral valve regurgitation. Tricuspid Valve: The tricuspid valve is normal in structure. Tricuspid valve regurgitation is trivial. Aortic Valve: The aortic valve is normal in structure. Aortic valve regurgitation is not visualized. Pulmonic Valve: The pulmonic valve was normal in structure. Pulmonic valve regurgitation is not visualized. Aorta: The aortic root is normal in size and structure. Venous: The inferior vena cava is normal in size with greater than 50% respiratory variability, suggesting right atrial pressure of 3 mmHg. IAS/Shunts: The interatrial septum was not assessed.  LEFT VENTRICLE PLAX 2D LVIDd:         4.93 cm  Diastology LVIDs:         2.98 cm  LV e' medial:    11.20 cm/s LV PW:         1.58 cm  LV E/e' medial:  5.2 LV IVS:        1.43 cm  LV e' lateral:   6.85 cm/s LVOT diam:     2.00 cm  LV E/e' lateral: 8.5 LVOT  Area:     3.14 cm  RIGHT VENTRICLE RV S prime:     18.20 cm/s TAPSE (M-mode): 2.9 cm LEFT ATRIUM             Index       RIGHT ATRIUM           Index LA diam:        3.80 cm 1.33 cm/m  RA Area:     17.10 cm LA Vol (A2C):   74.8 ml 26.20 ml/m RA Volume:   50.60 ml  17.72 ml/m LA Vol (A4C):   62.2 ml 21.79 ml/m LA Biplane Vol: 69.5 ml 24.34 ml/m   AORTA Ao Root diam: 3.10 cm MITRAL VALVE MV Area (PHT): 5.58 cm    SHUNTS MV Decel Time: 136 msec    Systemic Diam: 2.00 cm MV E velocity: 58.00 cm/s MV A velocity: 72.20 cm/s MV E/A ratio:  0.80 Dietrich Pates MD Electronically signed by Dietrich Pates MD Signature Date/Time: 06/30/2020/2:01:17 PM    Final     Scheduled Meds: . amLODipine  5 mg Oral Daily  . aspirin EC  81 mg Oral Daily  . atorvastatin  40 mg Oral Daily  . carvedilol  25 mg Oral BID WC  . pantoprazole  40 mg Oral Daily   Continuous Infusions: . heparin 2,700 Units/hr (07/01/20 1006)  . nitroGLYCERIN 45 mcg/min (07/01/20 1002)     LOS: 2 days    Time spent: 35 minutes   Vassie Loll, MD [facility]   To contact the attending provider between 7A-7P or the covering provider during after hours 7P-7A, please log into the web site [email or web address] and access using universal [place] password for that web site. If you do not have the password, please call the hospital operator.  07/01/2020, 3:40 PM

## 2020-07-01 NOTE — ED Notes (Addendum)
Pt sister called for update on patients bed status. Pt and family are very upset the patient is still in our ER and not at [facility] in a room with a window. Explained to patient there is not a bed at this time for him at [facility]. The sister stated the pt stated to her this rn was rude to him this morning when explained to him that his door needs to remain shut as the patient is covid positive. This nurse was not rude, and apologized for the misunderstanding. Charge nurse and PA made aware of this conversation.

## 2020-07-01 NOTE — Plan of Care (Signed)

## 2020-07-01 NOTE — ED Notes (Addendum)
Report given to [facility] 

## 2020-07-01 NOTE — Progress Notes (Signed)
ANTICOAGULATION CONSULT NOTE -   Pharmacy Consult for heparin Indication: chest pain/ACS/ NSTEMI  No Known Allergies  Patient Measurements: Height: 6\' 2"  (188 cm) Weight: (!) 172.4 kg (380 lb) IBW/kg (Calculated) : 82.2  HEPARIN DW (KG): 123.6  Vital Signs: BP: 130/91 (01/10 0915) Pulse Rate: 85 (01/10 0915)  Labs: Recent Labs    06/29/20 0706 06/29/20 1900 06/30/20 0504 06/30/20 1630 07/01/20 0136 07/01/20 0838  HGB 15.4  --   --   --   --  13.5  HCT 48.3  --   --   --   --  41.8  PLT 287  --   --   --   --  280  HEPARINUNFRC  --    < > <0.10* 0.22* 0.14* 0.36  CREATININE 0.89  --  0.81  --   --  0.78   < > = values in this interval not displayed.    Estimated Creatinine Clearance: 197.2 mL/min (by C-G formula based on SCr of 0.78 mg/dL).   Medical History: Past Medical History:  Diagnosis Date  . Heart murmur   . Hypertension     Medications:  (Not in a hospital admission)  Scheduled:  . amLODipine  5 mg Oral Daily  . aspirin EC  81 mg Oral Daily  . atorvastatin  40 mg Oral Daily  . carvedilol  25 mg Oral BID WC  . pantoprazole  40 mg Oral Daily   Infusions:  . heparin 2,700 Units/hr (07/01/20 1006)  . nitroGLYCERIN 45 mcg/min (07/01/20 1002)   PRN: acetaminophen **OR** acetaminophen, hydrALAZINE, morphine injection, oxyCODONE Anti-infectives (From admission, onward)   None      Assessment: Travis Beltran a 45 y.o. male requires anticoagulation with a heparin iv infusion for the indication of  chest pain/ACS. Heparin gtt will be started following pharmacy protocol per pharmacy consult. Patient is not on previous oral anticoagulant.   HL <= 0.36, therapeutic. No issues with IV site per RN  Goal of Therapy:  Heparin level 0.3-0.7 units/ml Monitor platelets by anticoagulation protocol: Yes   Plan:  Continue heparin infusion at 2700 units/hr Check anti-Xa level in 6 hours and daily while on heparin Continue to monitor H&H and platelets    59, BS Elder Cyphers, BCPS Clinical Pharmacist Pager [phone] 07/01/2020,10:50 AM

## 2020-07-01 NOTE — Progress Notes (Signed)
Pt received from [facility] ED to [phone] via [facility]. Oriented x4. CHG bath complete. CCMD called, VSS. Call bell in reach. Will continue to monitor.  Hazle Nordmann, RN

## 2020-07-02 ENCOUNTER — Other Ambulatory Visit (HOSPITAL_COMMUNITY): Payer: Self-pay | Admitting: Internal Medicine

## 2020-07-02 ENCOUNTER — Encounter (HOSPITAL_COMMUNITY)
Admission: EM | Disposition: A | Discharge status: Home / Self Care | Payer: Self-pay | Source: Home / Self Care | Attending: Internal Medicine

## 2020-07-02 DIAGNOSIS — Z720 Tobacco use: Secondary | ICD-10-CM

## 2020-07-02 DIAGNOSIS — I160 Hypertensive urgency: Secondary | ICD-10-CM

## 2020-07-02 DIAGNOSIS — R778 Other specified abnormalities of plasma proteins: Secondary | ICD-10-CM

## 2020-07-02 DIAGNOSIS — I10 Essential (primary) hypertension: Secondary | ICD-10-CM

## 2020-07-02 HISTORY — PX: LEFT HEART CATH AND CORONARY ANGIOGRAPHY: CATH118249

## 2020-07-02 LAB — BASIC METABOLIC PANEL
Anion gap: 6 (ref 5–15)
BUN: 10 mg/dL (ref 6–20)
CO2: 24 mmol/L (ref 22–32)
Calcium: 8.5 mg/dL — ABNORMAL LOW (ref 8.9–10.3)
Chloride: 107 mmol/L (ref 98–111)
Creatinine, Ser: 0.99 mg/dL (ref 0.61–1.24)
GFR, Estimated: >60 mL/min (ref >60)
Glucose, Bld: 103 mg/dL — ABNORMAL HIGH (ref 70–99)
Potassium: 3.7 mmol/L (ref 3.5–5.1)
Sodium: 137 mmol/L (ref 135–145)

## 2020-07-02 LAB — CBC
HCT: 40.4 % (ref 39.0–52.0)
Hemoglobin: 13 g/dL (ref 13.0–17.0)
MCH: 27.8 pg (ref 26.0–34.0)
MCHC: 32.2 g/dL (ref 30.0–36.0)
MCV: 86.5 fL (ref 80.0–100.0)
Platelets: 270 10*3/uL (ref 150–400)
RBC: 4.67 MIL/uL (ref 4.22–5.81)
RDW: 13.6 % (ref 11.5–15.5)
WBC: 9 10*3/uL (ref 4.0–10.5)
nRBC: 0 % (ref 0.0–0.2)

## 2020-07-02 LAB — HEPARIN LEVEL (UNFRACTIONATED): Heparin Unfractionated: 0.75 IU/mL — ABNORMAL HIGH (ref 0.30–0.70)

## 2020-07-02 SURGERY — LEFT HEART CATH AND CORONARY ANGIOGRAPHY
Anesthesia: LOCAL

## 2020-07-02 MED ORDER — ENOXAPARIN SODIUM 80 MG/0.8ML ~~LOC~~ SOLN: 80.0000 mg | SUBCUTANEOUS | Status: DC

## 2020-07-02 MED ORDER — ASPIRIN 81 MG PO CHEW: 81.0000 mg | CHEWABLE_TABLET | ORAL | Status: DC

## 2020-07-02 MED ORDER — SODIUM CHLORIDE 0.9 % IV SOLN: 250.0000 mL | INTRAVENOUS | Status: DC | PRN

## 2020-07-02 MED ORDER — SODIUM CHLORIDE 0.9% FLUSH: 3.0000 mL | Freq: Two times a day (BID) | INTRAVENOUS | Status: DC

## 2020-07-02 MED ORDER — FENTANYL CITRATE (PF) 100 MCG/2ML IJ SOLN
INTRAMUSCULAR | Status: AC
  Filled 2020-07-02: qty 2

## 2020-07-02 MED ORDER — HEPARIN SODIUM (PORCINE) 1000 UNIT/ML IJ SOLN
INTRAMUSCULAR | Status: DC | PRN
  Administered 2020-07-02: 6000 [IU] via INTRAVENOUS

## 2020-07-02 MED ORDER — ONDANSETRON HCL 4 MG/2ML IJ SOLN: 4.0000 mg | Freq: Four times a day (QID) | INTRAMUSCULAR | Status: DC | PRN

## 2020-07-02 MED ORDER — IOHEXOL 350 MG/ML SOLN
INTRAVENOUS | Status: DC | PRN
  Administered 2020-07-02: 80 mL via INTRA_ARTERIAL

## 2020-07-02 MED ORDER — SODIUM CHLORIDE 0.9% FLUSH: 3.0000 mL | INTRAVENOUS | Status: DC | PRN

## 2020-07-02 MED ORDER — MIDAZOLAM HCL 2 MG/2ML IJ SOLN
INTRAMUSCULAR | Status: DC | PRN
  Administered 2020-07-02: 2 mg via INTRAVENOUS

## 2020-07-02 MED ORDER — VERAPAMIL HCL 2.5 MG/ML IV SOLN
INTRAVENOUS | Status: DC | PRN
  Administered 2020-07-02: 10 mL via INTRA_ARTERIAL

## 2020-07-02 MED ORDER — HEPARIN SODIUM (PORCINE) 1000 UNIT/ML IJ SOLN
INTRAMUSCULAR | Status: AC
  Filled 2020-07-02: qty 1

## 2020-07-02 MED ORDER — LIDOCAINE HCL (PF) 1 % IJ SOLN
INTRAMUSCULAR | Status: DC | PRN
  Administered 2020-07-02: 2 mL

## 2020-07-02 MED ORDER — ATORVASTATIN CALCIUM 40 MG PO TABS: 40.0000 mg | ORAL_TABLET | Freq: Every day | ORAL | 0 refills | Status: DC

## 2020-07-02 MED ORDER — SODIUM CHLORIDE 0.9 % IV SOLN: INTRAVENOUS | Status: AC

## 2020-07-02 MED ORDER — AMLODIPINE BESYLATE 5 MG PO TABS: 5.0000 mg | ORAL_TABLET | Freq: Every day | ORAL | 0 refills | Status: DC

## 2020-07-02 MED ORDER — CARVEDILOL 25 MG PO TABS: 25.0000 mg | ORAL_TABLET | Freq: Two times a day (BID) | ORAL | 0 refills | Status: DC

## 2020-07-02 MED ORDER — VERAPAMIL HCL 2.5 MG/ML IV SOLN
INTRAVENOUS | Status: AC
  Filled 2020-07-02: qty 2

## 2020-07-02 MED ORDER — HEPARIN (PORCINE) IN NACL 1000-0.9 UT/500ML-% IV SOLN
INTRAVENOUS | Status: AC
  Filled 2020-07-02: qty 1000

## 2020-07-02 MED ORDER — PANTOPRAZOLE SODIUM 40 MG PO TBEC: 40.0000 mg | DELAYED_RELEASE_TABLET | Freq: Every day | ORAL | 0 refills | Status: DC

## 2020-07-02 MED ORDER — SODIUM CHLORIDE 0.9 % WEIGHT BASED INFUSION: 3.0000 mL/kg/h | INTRAVENOUS | Status: DC

## 2020-07-02 MED ORDER — LIDOCAINE HCL (PF) 1 % IJ SOLN
INTRAMUSCULAR | Status: AC
  Filled 2020-07-02: qty 30

## 2020-07-02 MED ORDER — HEPARIN (PORCINE) IN NACL 1000-0.9 UT/500ML-% IV SOLN
INTRAVENOUS | Status: DC | PRN
  Administered 2020-07-02 (×2): 500 mL

## 2020-07-02 MED ORDER — ASPIRIN 81 MG PO TBEC: 81.0000 mg | DELAYED_RELEASE_TABLET | Freq: Every day | ORAL | 0 refills | Status: DC

## 2020-07-02 MED ORDER — FENTANYL CITRATE (PF) 100 MCG/2ML IJ SOLN
INTRAMUSCULAR | Status: DC | PRN
  Administered 2020-07-02: 25 ug via INTRAVENOUS

## 2020-07-02 MED ORDER — MIDAZOLAM HCL 2 MG/2ML IJ SOLN
INTRAMUSCULAR | Status: AC
  Filled 2020-07-02: qty 2

## 2020-07-02 MED ORDER — LABETALOL HCL 5 MG/ML IV SOLN: 10.0000 mg | INTRAVENOUS | Status: DC | PRN

## 2020-07-02 MED ORDER — ASPIRIN 81 MG PO CHEW
81.0000 mg | CHEWABLE_TABLET | ORAL | Status: AC
  Administered 2020-07-02: 81 mg via ORAL
  Filled 2020-07-02: qty 1

## 2020-07-02 MED ORDER — SODIUM CHLORIDE 0.9 % WEIGHT BASED INFUSION
1.0000 mL/kg/h | INTRAVENOUS | Status: DC
  Administered 2020-07-02: 1 mL/kg/h via INTRAVENOUS

## 2020-07-02 MED FILL — CARVEDILOL 25 MG TABLET: 25 | 30 days supply | Qty: 60 | Fill #0

## 2020-07-02 MED FILL — ATORVASTATIN CALCIUM 40 MG: 40 | 30 days supply | Qty: 30 | Fill #0

## 2020-07-02 MED FILL — ASPIRIN LOW DOSE 81 MG TBEC: 81 | 30 days supply | Qty: 30 | Fill #0

## 2020-07-02 MED FILL — AMLODIPINE BESYLATE 5 MG TA: 5 | 30 days supply | Qty: 30 | Fill #0

## 2020-07-02 MED FILL — PANTOPRAZOLE SOD DR 40 MG T: 40 | 30 days supply | Qty: 30 | Fill #0

## 2020-07-02 SURGICAL SUPPLY — 10 items
CATH 5FR JL3.5 JR4 ANG PIG MP (CATHETERS) ×1 IMPLANT
DEVICE RAD COMP TR BAND LRG (VASCULAR PRODUCTS) ×1 IMPLANT
DEVICE RAD TR BAND REGULAR (VASCULAR PRODUCTS) IMPLANT
GLIDESHEATH SLEND SS 6F .021 (SHEATH) ×1 IMPLANT
GUIDEWIRE INQWIRE 1.5J.035X260 (WIRE) IMPLANT
INQWIRE 1.5J .035X260CM (WIRE) ×2
KIT HEART LEFT (KITS) ×2 IMPLANT
PACK CARDIAC CATHETERIZATION (CUSTOM PROCEDURE TRAY) ×2 IMPLANT
TRANSDUCER W/STOPCOCK (MISCELLANEOUS) ×2 IMPLANT
TUBING CIL FLEX 10 FLL-RA (TUBING) ×2 IMPLANT

## 2020-07-02 NOTE — Discharge Summary (Signed)
PATIENT DETAILS Name: Travis Beltran Age: 45 y.o. Sex: male Date of Birth: 1976/01/22 MRN: [id number]. Admitting Physician: Vassie Loll, MD [id number], Denman George, MD  Admit Date: 06/29/2020 Discharge date: 07/02/2020  Recommendations for Outpatient Follow-up:  1. Follow up with PCP in 1-2 weeks 2. Please obtain CMP/CBC in one week 3. Needs outpatient sleep study 4. Needs evaluation for right gynecomastia  Admitted From:  Home  Disposition: Home   Home Health: No  Equipment/Devices: None  Discharge Condition: Stable  CODE STATUS: FULL CODE  Diet recommendation:  Diet Order            Diet Heart Room service appropriate? Yes; Fluid consistency: Thin  Diet effective now           Diet - low sodium heart healthy                  Brief Narrative: Patient is a 45 y.o. male with PMHx of HTN-who was diagnosed with COVID-19 on 1/4-presented to the ED on 1/8 with chest pain-found to have non-STEMI-manage medically at [facility] subsequently transferred to [facility] for LHC.  See below for further details  COVID-19 vaccinated status: Unvaccinated  Significant Events: 1/8>> Admit to [facility] for chest pain-likely non-STEMI. 1/10>> transferred to [facility]  Significant studies: 1/8>> CTA chest: No PE, no pneumonia, asymmetric right gynecomastia 1/9>> Echo: EF 65-70%  COVID-19 medications: None  Antibiotics: None  Microbiology data: None  Procedures: 1/11>>LHC: 1. 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 Normal LV EDP  Consults: Cardiology  Brief Hospital Course: Non-STEMI:  No further chest pain-LHC without significant CAD-spoke with Dr. Keith Rake are for continued medical management.  Okay to discharge home per cardiology.    HTN: BP relatively stable-continue Coreg/amlodipine.  HLD: Continue statin  GERD: Continue PPI  Probable OSA: Encourage patient to follow-up with PCP for outpatient sleep  study  Asymmetric gynecomastia: Incidental finding on CT chest-stable for outpatient work-up by PCP.  Patient aware of this finding-and the need for outpatient work-up.  COVID-19 infection: Currently asymptomatic-diagnosed on 1/4-CTA chest without pneumonia-requires for total of 10 days of isolation.  Morbid Obesity: Estimated body mass index is 49.05 kg/m as calculated from the following:   Height as of this encounter: 6\' 2"  (1.88 m).   Weight as of this encounter: 173.3 kg.    Discharge Diagnoses:  Active Problems:   NSTEMI (non-ST elevated myocardial infarction) [facility])   Discharge Instructions:    Person Under Monitoring Name: Travis Beltran  Location: [address] [place] [address]   Infection Prevention Recommendations for Individuals Confirmed to have, or Being Evaluated for, 2019 Novel Coronavirus (COVID-19) Infection Who Receive Care at Home  Individuals who are confirmed to have, or are being evaluated for, COVID-19 should follow the prevention steps below until a healthcare provider or local or state health department says they can return to normal activities.  Stay home except to get medical care You should restrict activities outside your home, except for getting medical care. Do not go to work, school, or public areas, and do not use public transportation or taxis.  Call ahead before visiting your doctor Before your medical appointment, call the healthcare provider and tell them that you have, or are being evaluated for, COVID-19 infection. This will help the healthcare provider's office take steps to keep other people from getting infected. Ask your healthcare provider to call the local or state health department.  Monitor your symptoms Seek prompt medical attention if your  illness is worsening (e.g., difficulty breathing). Before going to your medical appointment, call the healthcare provider and tell them that you have, or are being evaluated  for, COVID-19 infection. Ask your healthcare provider to call the local or state health department.  Wear a facemask You should wear a facemask that covers your nose and mouth when you are in the same room with other people and when you visit a healthcare provider. People who live with or visit you should also wear a facemask while they are in the same room with you.  Separate yourself from other people in your home As much as possible, you should stay in a different room from other people in your home. Also, you should use a separate bathroom, if available.  Avoid sharing household items You should not share dishes, drinking glasses, cups, eating utensils, towels, bedding, or other items with other people in your home. After using these items, you should wash them thoroughly with soap and water.  Cover your coughs and sneezes Cover your mouth and nose with a tissue when you cough or sneeze, or you can cough or sneeze into your sleeve. Throw used tissues in a lined trash can, and immediately wash your hands with soap and water for at least 20 seconds or use an alcohol-based hand rub.  Wash your [employer] your hands often and thoroughly with soap and water for at least 20 seconds. You can use an alcohol-based hand sanitizer if soap and water are not available and if your hands are not visibly dirty. Avoid touching your eyes, nose, and mouth with unwashed hands.   Prevention Steps for Caregivers and Household Members of Individuals Confirmed to have, or Being Evaluated for, COVID-19 Infection Being Cared for in the Home  If you live with, or provide care at home for, a person confirmed to have, or being evaluated for, COVID-19 infection please follow these guidelines to prevent infection:  Follow healthcare provider's instructions Make sure that you understand and can help the patient follow any healthcare provider instructions for all care.  Provide for the patient's basic  needs You should help the patient with basic needs in the home and provide support for getting groceries, prescriptions, and other personal needs.  Monitor the patient's symptoms If they are getting sicker, call his or her medical provider and tell them that the patient has, or is being evaluated for, COVID-19 infection. This will help the healthcare provider's office take steps to keep other people from getting infected. Ask the healthcare provider to call the local or state health department.  Limit the number of people who have contact with the patient  If possible, have only one caregiver for the patient.  Other household members should stay in another home or place of residence. If this is not possible, they should stay  in another room, or be separated from the patient as much as possible. Use a separate bathroom, if available.  Restrict visitors who do not have an essential need to be in the home.  Keep older adults, very young children, and other sick people away from the patient Keep older adults, very young children, and those who have compromised immune systems or chronic health conditions away from the patient. This includes people with chronic heart, lung, or kidney conditions, diabetes, and cancer.  Ensure good ventilation Make sure that shared spaces in the home have good air flow, such as from an air conditioner or an opened window, weather permitting.  Wash your  hands often  Wash your hands often and thoroughly with soap and water for at least 20 seconds. You can use an alcohol based hand sanitizer if soap and water are not available and if your hands are not visibly dirty.  Avoid touching your eyes, nose, and mouth with unwashed hands.  Use disposable paper towels to dry your hands. If not available, use dedicated cloth towels and replace them when they become wet.  Wear a facemask and gloves  Wear a disposable facemask at all times in the room and gloves when  you touch or have contact with the patient's blood, body fluids, and/or secretions or excretions, such as sweat, saliva, sputum, nasal mucus, vomit, urine, or feces.  Ensure the mask fits over your nose and mouth tightly, and do not touch it during use.  Throw out disposable facemasks and gloves after using them. Do not reuse.  Wash your hands immediately after removing your facemask and gloves.  If your personal clothing becomes contaminated, carefully remove clothing and launder. Wash your hands after handling contaminated clothing.  Place all used disposable facemasks, gloves, and other waste in a lined container before disposing them with other household waste.  Remove gloves and wash your hands immediately after handling these items.  Do not share dishes, glasses, or other household items with the patient  Avoid sharing household items. You should not share dishes, drinking glasses, cups, eating utensils, towels, bedding, or other items with a patient who is confirmed to have, or being evaluated for, COVID-19 infection.  After the person uses these items, you should wash them thoroughly with soap and water.  Wash laundry thoroughly  Immediately remove and wash clothes or bedding that have blood, body fluids, and/or secretions or excretions, such as sweat, saliva, sputum, nasal mucus, vomit, urine, or feces, on them.  Wear gloves when handling laundry from the patient.  Read and follow directions on labels of laundry or clothing items and detergent. In general, wash and dry with the warmest temperatures recommended on the label.  Clean all areas the individual has used often  Clean all touchable surfaces, such as counters, tabletops, doorknobs, bathroom fixtures, toilets, phones, keyboards, tablets, and bedside tables, every day. Also, clean any surfaces that may have blood, body fluids, and/or secretions or excretions on them.  Wear gloves when cleaning surfaces the patient has  come in contact with.  Use a diluted bleach solution (e.g., dilute bleach with 1 part bleach and 10 parts water) or a household disinfectant with a label that says EPA-registered for coronaviruses. To make a bleach solution at home, add 1 tablespoon of bleach to 1 quart (4 cups) of water. For a larger supply, add  cup of bleach to 1 gallon (16 cups) of water.  Read labels of cleaning products and follow recommendations provided on product labels. Labels contain instructions for safe and effective use of the cleaning product including precautions you should take when applying the product, such as wearing gloves or eye protection and making sure you have good ventilation during use of the product.  Remove gloves and wash hands immediately after cleaning.  Monitor yourself for signs and symptoms of illness Caregivers and household members are considered close contacts, should monitor their health, and will be asked to limit movement outside of the home to the extent possible. Follow the monitoring steps for close contacts listed on the symptom monitoring form.   ? If you have additional questions, contact your local health department or call the  epidemiologist on call at [phone] (available 24/7). ? This guidance is subject to change. For the most up-to-date guidance from CDC, please refer to their website: [employer]Met[employer].hu    Activity:  As tolerated   Discharge Instructions    Call MD for:  difficulty breathing, headache or visual disturbances   Complete by: As directed    Diet - low sodium heart healthy   Complete by: As directed    Discharge instructions   Complete by: As directed    1.)  10 days of isolation from 06/25/2020  2.)  If you develop shortness of breath-please seek immediate medical attention  3.)  Please ask your primary care practitioner to get a referral for sleep study-you probably have undiagnosed sleep  apnea.  4.)  Incidental finding-CT chest done at [facility] showed that your right breast was enlarged-please ask your primary care practitioner to perform further work-up-in some cases-you could have a severe hormonal abnormality.   Follow with Primary MD  Roma Kayser, MD in 1-2 weeks  Please get a complete blood count and chemistry panel checked by your Primary MD at your next visit, and again as instructed by your Primary MD.  Get Medicines reviewed and adjusted: Please take all your medications with you for your next visit with your Primary MD  Laboratory/radiological data: Please request your Primary MD to go over all hospital tests and procedure/radiological results at the follow up, please ask your Primary MD to get all Hospital records sent to his/her office.  In some cases, they will be blood work, cultures and biopsy results pending at the time of your discharge. Please request that your primary care M.D. follows up on these results.  Also Note the following: If you experience worsening of your admission symptoms, develop shortness of breath, life threatening emergency, suicidal or homicidal thoughts you must seek medical attention immediately by calling 911 or calling your MD immediately  if symptoms less severe.  You must read complete instructions/literature along with all the possible adverse reactions/side effects for all the Medicines you take and that have been prescribed to you. Take any new Medicines after you have completely understood and accpet all the possible adverse reactions/side effects.   Do not drive when taking Pain medications or sleeping medications (Benzodaizepines)  Do not take more than prescribed Pain, Sleep and Anxiety Medications. It is not advisable to combine anxiety,sleep and pain medications without talking with your primary care practitioner  Special Instructions: If you have smoked or chewed Tobacco  in the last 2 yrs please stop  smoking, stop any regular Alcohol  and or any Recreational drug use.  Wear Seat belts while driving.  Please note: You were cared for by a hospitalist during your hospital stay. Once you are discharged, your primary care physician will handle any further medical issues. Please note that NO REFILLS for any discharge medications will be authorized once you are discharged, as it is imperative that you return to your primary care physician (or establish a relationship with a primary care physician if you do not have one) for your post hospital discharge needs so that they can reassess your need for medications and monitor your lab values.   Increase activity slowly   Complete by: As directed      Allergies as of 07/02/2020   No Known Allergies     Medication List    STOP taking these medications   albuterol 108 (90 Base) MCG/ACT inhaler Commonly known as: VENTOLIN  HFA   guaiFENesin 600 MG 12 hr tablet Commonly known as: MUCINEX     TAKE these medications   amLODipine 5 MG tablet Commonly known as: NORVASC Take 1 tablet (5 mg total) by mouth daily. Start taking on: July 03, 2020   aspirin 81 MG EC tablet Take 1 tablet (81 mg total) by mouth daily. Swallow whole. Start taking on: July 03, 2020   atorvastatin 40 MG tablet Commonly known as: LIPITOR Take 1 tablet (40 mg total) by mouth daily. Start taking on: July 03, 2020   carvedilol 25 MG tablet Commonly known as: COREG Take 1 tablet (25 mg total) by mouth 2 (two) times daily with a meal.   pantoprazole 40 MG tablet Commonly known as: PROTONIX Take 1 tablet (40 mg total) by mouth daily. Start taking on: July 03, 2020       Follow-up Information    Nida, Denman George, MD. Schedule an appointment as soon as possible for a visit in 1 week(s).   Specialty: Endocrinology Contact information: [address] [place] [address] [phone]        Antoine Poche, MD. Schedule an appointment as  soon as possible for a visit in 1 month(s).   Specialty: Cardiology Contact information: [address] [place] [address] [phone]              No Known Allergies     Other Procedures/Studies: CT Angio Chest PE W and/or Wo Contrast  Result Date: 06/29/2020 CLINICAL DATA:  45 year old male with suspected pulmonary embolism, presenting with chest pain after recent positive COVID test. EXAM: CT ANGIOGRAPHY CHEST WITH CONTRAST TECHNIQUE: Multidetector CT imaging of the chest was performed using the standard protocol during bolus administration of intravenous contrast. Multiplanar CT image reconstructions and MIPs were obtained to evaluate the vascular anatomy. CONTRAST:  100 mL Omnipaque 350, intravenous COMPARISON:  None. FINDINGS: Cardiovascular: Satisfactory opacification of the pulmonary arteries to the segmental level. No evidence of pulmonary embolism. Mild global cardiomegaly. No pericardial effusion. Mediastinum/Nodes: No enlarged mediastinal, hilar, or axillary lymph nodes. Thyroid gland, trachea, and esophagus demonstrate no significant findings. Lungs/Pleura: No focal consolidations. 5 mm pleural based solid pulmonary nodule in the posterior aspect of the anterior segment of the upper lobe. Mild lingular and left basilar subsegmental atelectasis. No pleural effusion or pneumothorax. Upper Abdomen: Diffusely decreased attenuation of the hepatic parenchyma. The visualized upper abdomen is otherwise within normal limits. Musculoskeletal: No acute fracture or aggressive appearing osseous lesion. Asymmetric right gynecomastia. Review of the MIP images confirms the above findings. IMPRESSION: Vascular: No evidence of acute pulmonary embolism.  Mild global cardiomegaly. Non-Vascular: 1. Asymmetric right gynecomastia. 2. Hepatic steatosis. 3. No evidence of pneumonia. Marliss Coots, MD [facility] Radiology Electronically Signed   By: Marliss Coots MD   On: 06/29/2020 10:33   CARDIAC CATHETERIZATION  Result Date: 07/02/2020  LV end diastolic pressure is normal.  1. 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 Normal LV EDP Plan: medical management.   DG Chest Portable 1 View  Result Date: 06/29/2020 CLINICAL DATA:  Chest pain.  COVID positive EXAM: PORTABLE CHEST 1 VIEW COMPARISON:  05/25/2017 FINDINGS: No convincing infiltrate. Generous heart size but accentuated by technique and low volume chest. Extensive artifact from EKG leads. No edema, effusion, or air leak. IMPRESSION: Negative low volume chest. Electronically Signed   By: Marnee Spring M.D.   On: 06/29/2020 07:33   ECHOCARDIOGRAM COMPLETE  Result  Date: 06/30/2020    ECHOCARDIOGRAM REPORT   Patient Name:   Travis Beltran Date of Exam: 06/30/2020 Medical Rec #:  [id number]          Height:       74.0 in Accession #:    [id number]         Weight:       380.0 lb Date of Birth:  11-Jul-1975         BSA:          2.855 m Patient Age:    44 years           BP:           143/93 mmHg Patient Gender: M                  HR:           82 bpm. [facility] Procedure: 2D Echo Indications:    Chest Pain R07.9  History:        Patient has no prior history of Echocardiogram examinations.                 Previous Myocardial Infarction, Signs/Symptoms:Murmur; Risk                 Factors:Hypertension and Current Smoker. Covid 19.  Sonographer:    Jeryl ColumbiaJohanna Elliott RDCS (AE) Referring Phys: [id number] [facility] DAHAL IMPRESSIONS  1. Left ventricular ejection fraction, by estimation, is 65 to 70%. The left ventricle has normal function. The left ventricle has no regional wall motion abnormalities. There is moderate left ventricular hypertrophy. Left ventricular diastolic parameters are indeterminate.  2. Right ventricular systolic function is normal. The right ventricular size is normal.  3. The mitral valve is normal in structure. Trivial mitral valve regurgitation.  4.  The aortic valve is normal in structure. Aortic valve regurgitation is not visualized.  5. The inferior vena cava is normal in size with greater than 50% respiratory variability, suggesting right atrial pressure of 3 mmHg. FINDINGS  Left Ventricle: Left ventricular ejection fraction, by estimation, is 65 to 70%. The left ventricle has normal function. The left ventricle has no regional wall motion abnormalities. The left ventricular internal cavity size was normal in size. There is  moderate left ventricular hypertrophy. Left ventricular diastolic parameters are indeterminate. Right Ventricle: The right ventricular size is normal. Right vetricular wall thickness was not assessed. Right ventricular systolic function is normal. Left Atrium: Left atrial size was normal in size. Right Atrium: Right atrial size was normal in size. Pericardium: There is no evidence of pericardial effusion. Mitral Valve: The mitral valve is normal in structure. Trivial mitral valve regurgitation. Tricuspid Valve: The tricuspid valve is normal in structure. Tricuspid valve regurgitation is trivial. Aortic Valve: The aortic valve is normal in structure. Aortic valve regurgitation is not visualized. Pulmonic Valve: The pulmonic valve was normal in structure. Pulmonic valve regurgitation is not visualized. Aorta: The aortic root is normal in size and structure. Venous: The inferior vena cava is normal in size with greater than 50% respiratory variability, suggesting right atrial pressure of 3 mmHg. IAS/Shunts: The interatrial septum was not assessed.  LEFT VENTRICLE PLAX 2D LVIDd:         4.93 cm  Diastology LVIDs:         2.98 cm  LV e' medial:    11.20 cm/s LV PW:         1.58 cm  LV E/e' medial:  5.2  LV IVS:        1.43 cm  LV e' lateral:   6.85 cm/s LVOT diam:     2.00 cm  LV E/e' lateral: 8.5 LVOT Area:     3.14 cm  RIGHT VENTRICLE RV S prime:     18.20 cm/s TAPSE (M-mode): 2.9 cm LEFT ATRIUM             Index       RIGHT ATRIUM            Index LA diam:        3.80 cm 1.33 cm/m  RA Area:     17.10 cm LA Vol (A2C):   74.8 ml 26.20 ml/m RA Volume:   50.60 ml  17.72 ml/m LA Vol (A4C):   62.2 ml 21.79 ml/m LA Biplane Vol: 69.5 ml 24.34 ml/m   AORTA Ao Root diam: 3.10 cm MITRAL VALVE MV Area (PHT): 5.58 cm    SHUNTS MV Decel Time: 136 msec    Systemic Diam: 2.00 cm MV E velocity: 58.00 cm/s MV A velocity: 72.20 cm/s MV E/A ratio:  0.80 Dietrich Pates MD Electronically signed by Dietrich Pates MD Signature Date/Time: 06/30/2020/2:01:17 PM    Final      TODAY-DAY OF DISCHARGE:  Subjective:   Travis Beltran today has no headache,no chest abdominal pain,no new weakness tingling or numbness, feels much better wants to go home today.  Objective:   Blood pressure 125/76, pulse 79, temperature 98.1 F (36.7 C), temperature source Oral, resp. rate 16, height 6\' 2"  (1.88 m), weight (!) 173.3 kg, SpO2 94 %.  Intake/Output Summary (Last 24 hours) at 07/02/2020 1244 Last data filed at 07/01/2020 1622 Gross per 24 hour  Intake 1183.57 ml  Output -  Net 1183.57 ml   Filed Weights   06/29/20 0659 07/01/20 1557  Weight: (!) 172.4 kg (!) 173.3 kg    Exam: Awake Alert, Oriented *3, No new F.N deficits, Normal affect [place].AT,PERRAL Supple Neck,No JVD, No cervical lymphadenopathy appriciated.  Symmetrical Chest wall movement, Good air movement bilaterally, CTAB RRR,No Gallops,Rubs or new Murmurs, No Parasternal Heave +ve B.Sounds, Abd Soft, Non tender, No organomegaly appriciated, No rebound -guarding or rigidity. No Cyanosis, Clubbing or edema, No new Rash or bruise   PERTINENT RADIOLOGIC STUDIES: CT Angio Chest PE W and/or Wo Contrast  Result Date: 06/29/2020 CLINICAL DATA:  45 year old male with suspected pulmonary embolism, presenting with chest pain after recent positive COVID test. EXAM: CT ANGIOGRAPHY CHEST WITH CONTRAST TECHNIQUE: Multidetector CT imaging of the chest was performed using the standard protocol during bolus  administration of intravenous contrast. Multiplanar CT image reconstructions and MIPs were obtained to evaluate the vascular anatomy. CONTRAST:  100 mL Omnipaque 350, intravenous COMPARISON:  None. FINDINGS: Cardiovascular: Satisfactory opacification of the pulmonary arteries to the segmental level. No evidence of pulmonary embolism. Mild global cardiomegaly. No pericardial effusion. Mediastinum/Nodes: No enlarged mediastinal, hilar, or axillary lymph nodes. Thyroid gland, trachea, and esophagus demonstrate no significant findings. Lungs/Pleura: No focal consolidations. 5 mm pleural based solid pulmonary nodule in the posterior aspect of the anterior segment of the upper lobe. Mild lingular and left basilar subsegmental atelectasis. No pleural effusion or pneumothorax. Upper Abdomen: Diffusely decreased attenuation of the hepatic parenchyma. The visualized upper abdomen is otherwise within normal limits. Musculoskeletal: No acute fracture or aggressive appearing osseous lesion. Asymmetric right gynecomastia. Review of the MIP images confirms the above findings. IMPRESSION: Vascular: No evidence of acute pulmonary embolism.  Mild global cardiomegaly. Non-Vascular:  1. Asymmetric right gynecomastia. 2. Hepatic steatosis. 3. No evidence of pneumonia. Marliss Coots, MD [facility] Electronically Signed   By: Marliss Coots MD   On: 06/29/2020 10:33   CARDIAC CATHETERIZATION  Result Date: 07/02/2020  LV end diastolic pressure is normal.  1. 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 Normal LV EDP Plan: medical management.   DG Chest Portable 1 View  Result Date: 06/29/2020 CLINICAL DATA:  Chest pain.  COVID positive EXAM: PORTABLE CHEST 1 VIEW COMPARISON:  05/25/2017 FINDINGS: No convincing infiltrate. Generous heart size but accentuated by technique and low volume chest. Extensive artifact from EKG leads. No edema, effusion, or air  leak. IMPRESSION: Negative low volume chest. Electronically Signed   By: Marnee Spring M.D.   On: 06/29/2020 07:33   ECHOCARDIOGRAM COMPLETE  Result Date: 06/30/2020    ECHOCARDIOGRAM REPORT   Patient Name:   Travis Beltran Date of Exam: 06/30/2020 Medical Rec #:  [id number]          Height:       74.0 in Accession #:    [id number]         Weight:       380.0 lb Date of Birth:  08/18/1975         BSA:          2.855 m Patient Age:    44 years           BP:           143/93 mmHg Patient Gender: M                  HR:           82 bpm. [facility] Procedure: 2D Echo Indications:    Chest Pain R07.9  History:        Patient has no prior history of Echocardiogram examinations.                 Previous Myocardial Infarction, Signs/Symptoms:Murmur; Risk                 Factors:Hypertension and Current Smoker. Covid 19.  Sonographer:    Jeryl Columbia RDCS (AE) Referring Phys: [id number] [facility] IMPRESSIONS  1. Left ventricular ejection fraction, by estimation, is 65 to 70%. The left ventricle has normal function. The left ventricle has no regional wall motion abnormalities. There is moderate left ventricular hypertrophy. Left ventricular diastolic parameters are indeterminate.  2. Right ventricular systolic function is normal. The right ventricular size is normal.  3. The mitral valve is normal in structure. Trivial mitral valve regurgitation.  4. The aortic valve is normal in structure. Aortic valve regurgitation is not visualized.  5. The inferior vena cava is normal in size with greater than 50% respiratory variability, suggesting right atrial pressure of 3 mmHg. FINDINGS  Left Ventricle: Left ventricular ejection fraction, by estimation, is 65 to 70%. The left ventricle has normal function. The left ventricle has no regional wall motion abnormalities. The left ventricular internal cavity size was normal in size. There is  moderate left ventricular hypertrophy. Left ventricular diastolic  parameters are indeterminate. Right Ventricle: The right ventricular size is normal. Right vetricular wall thickness was not assessed. Right ventricular systolic function is normal. Left Atrium: Left atrial size was normal in size. Right Atrium: Right atrial size was normal in size. Pericardium: There is no evidence of pericardial effusion. Mitral Valve: The mitral valve  is normal in structure. Trivial mitral valve regurgitation. Tricuspid Valve: The tricuspid valve is normal in structure. Tricuspid valve regurgitation is trivial. Aortic Valve: The aortic valve is normal in structure. Aortic valve regurgitation is not visualized. Pulmonic Valve: The pulmonic valve was normal in structure. Pulmonic valve regurgitation is not visualized. Aorta: The aortic root is normal in size and structure. Venous: The inferior vena cava is normal in size with greater than 50% respiratory variability, suggesting right atrial pressure of 3 mmHg. IAS/Shunts: The interatrial septum was not assessed.  LEFT VENTRICLE PLAX 2D LVIDd:         4.93 cm  Diastology LVIDs:         2.98 cm  LV e' medial:    11.20 cm/s LV PW:         1.58 cm  LV E/e' medial:  5.2 LV IVS:        1.43 cm  LV e' lateral:   6.85 cm/s LVOT diam:     2.00 cm  LV E/e' lateral: 8.5 LVOT Area:     3.14 cm  RIGHT VENTRICLE RV S prime:     18.20 cm/s TAPSE (M-mode): 2.9 cm LEFT ATRIUM             Index       RIGHT ATRIUM           Index LA diam:        3.80 cm 1.33 cm/m  RA Area:     17.10 cm LA Vol (A2C):   74.8 ml 26.20 ml/m RA Volume:   50.60 ml  17.72 ml/m LA Vol (A4C):   62.2 ml 21.79 ml/m LA Biplane Vol: 69.5 ml 24.34 ml/m   AORTA Ao Root diam: 3.10 cm MITRAL VALVE MV Area (PHT): 5.58 cm    SHUNTS MV Decel Time: 136 msec    Systemic Diam: 2.00 cm MV E velocity: 58.00 cm/s MV A velocity: 72.20 cm/s MV E/A ratio:  0.80 Dietrich Pates MD Electronically signed by Dietrich Pates MD Signature Date/Time: 06/30/2020/2:01:17 PM    Final      PERTINENT LAB  RESULTS: CBC: Recent Labs    07/01/20 0838 07/02/20 0210  WBC 8.8 9.0  HGB 13.5 13.0  HCT 41.8 40.4  PLT 280 270   CMET CMP     Component Value Date/Time   NA 137 07/02/2020 0210   K 3.7 07/02/2020 0210   CL 107 07/02/2020 0210   CO2 24 07/02/2020 0210   GLUCOSE 103 (H) 07/02/2020 0210   BUN 10 07/02/2020 0210   CREATININE 0.99 07/02/2020 0210   CALCIUM 8.5 (L) 07/02/2020 0210   PROT 6.9 06/29/2020 0706   ALBUMIN 3.6 06/29/2020 0706   AST 23 06/29/2020 0706   ALT 20 06/29/2020 0706   ALKPHOS 81 06/29/2020 0706   BILITOT 0.6 06/29/2020 0706   GFRNONAA >60 07/02/2020 0210   GFRAA >60 06/18/2015 2126    GFR Estimated Creatinine Clearance: 159.7 mL/min (by C-G formula based on SCr of 0.99 mg/dL). No results for input(s): LIPASE, AMYLASE in the last 72 hours. No results for input(s): CKTOTAL, CKMB, CKMBINDEX, TROPONINI in the last 72 hours. Invalid input(s): POCBNP No results for input(s): DDIMER in the last 72 hours. Recent Labs    06/29/20 1257  HGBA1C 5.6   No results for input(s): CHOL, HDL, LDLCALC, TRIG, CHOLHDL, LDLDIRECT in the last 72 hours. Recent Labs    06/29/20 1257  TSH 1.055   No results for input(s): VITAMINB12, FOLATE,  FERRITIN, TIBC, IRON, RETICCTPCT in the last 72 hours. Coags: No results for input(s): INR in the last 72 hours.  Invalid input(s): PT Microbiology: No results found for this or any previous visit (from the past 240 hour(s)).  FURTHER DISCHARGE INSTRUCTIONS:  Get Medicines reviewed and adjusted: Please take all your medications with you for your next visit with your Primary MD  Laboratory/radiological data: Please request your Primary MD to go over all hospital tests and procedure/radiological results at the follow up, please ask your Primary MD to get all Hospital records sent to his/her office.  In some cases, they will be blood work, cultures and biopsy results pending at the time of your discharge. Please request that  your primary care M.D. goes through all the records of your hospital data and follows up on these results.  Also Note the following: If you experience worsening of your admission symptoms, develop shortness of breath, life threatening emergency, suicidal or homicidal thoughts you must seek medical attention immediately by calling 911 or calling your MD immediately  if symptoms less severe.  You must read complete instructions/literature along with all the possible adverse reactions/side effects for all the Medicines you take and that have been prescribed to you. Take any new Medicines after you have completely understood and accpet all the possible adverse reactions/side effects.   Do not drive when taking Pain medications or sleeping medications (Benzodaizepines)  Do not take more than prescribed Pain, Sleep and Anxiety Medications. It is not advisable to combine anxiety,sleep and pain medications without talking with your primary care practitioner  Special Instructions: If you have smoked or chewed Tobacco  in the last 2 yrs please stop smoking, stop any regular Alcohol  and or any Recreational drug use.  Wear Seat belts while driving.  Please note: You were cared for by a hospitalist during your hospital stay. Once you are discharged, your primary care physician will handle any further medical issues. Please note that NO REFILLS for any discharge medications will be authorized once you are discharged, as it is imperative that you return to your primary care physician (or establish a relationship with a primary care physician if you do not have one) for your post hospital discharge needs so that they can reassess your need for medications and monitor your lab values.  Total Time spent coordinating discharge including counseling, education and face to face time equals 35 minutes.  SignedJeoffrey Massed: Shanker Ghimire 07/02/2020 12:44 PM

## 2020-07-02 NOTE — H&P (View-Only) (Signed)
Progress Note  Patient Name: Travis Beltran Date of Encounter: 07/02/2020  [facility] Cardiologist: Dina Rich, MD   Subjective   No further chest pain.   No SHOB.  He has not had any sx of cough since Jan 4th when he went to the ER.  Inpatient Medications    Scheduled Meds:  amLODipine  5 mg Oral Daily   aspirin EC  81 mg Oral Daily   atorvastatin  40 mg Oral Daily   carvedilol  25 mg Oral BID WC   pantoprazole  40 mg Oral Daily   sodium chloride flush  3 mL Intravenous Q12H   Continuous Infusions:  sodium chloride     sodium chloride 1 mL/kg/hr (07/02/20 0804)   heparin 2,900 Units/hr (07/02/20 0809)   nitroGLYCERIN 45 mcg/min (07/02/20 0731)   PRN Meds: sodium chloride, acetaminophen **OR** acetaminophen, hydrALAZINE, morphine injection, oxyCODONE, sodium chloride flush   Vital Signs    Vitals:   07/01/20 1557 07/01/20 2057 07/01/20 2254 07/02/20 0526  BP: 130/89 (!) 109/42 110/77 127/83  Pulse: 84 88 90 84  Resp: 20 18 (!) 21 14  Temp: 98.7 F (37.1 C) 98.4 F (36.9 C) 98.6 F (37 C) 98 F (36.7 C)  TempSrc: Oral Oral Oral Oral  SpO2:  92% (!) 87% 90%  Weight: (!) 173.3 kg     Height: 6\' 2"  (1.88 m)       Intake/Output Summary (Last 24 hours) at 07/02/2020 0850 Last data filed at 07/01/2020 1622 Gross per 24 hour  Intake 1669.29 ml  Output --  Net 1669.29 ml   Last 3 Weights 07/01/2020 06/29/2020 06/25/2020  Weight (lbs) 382 lb 380 lb 386 lb  Weight (kg) 173.274 kg 172.367 kg 175.088 kg      Telemetry    NSR - Personally Reviewed  ECG    Normal ECG - Personally Reviewed  Physical Exam  Exam limited by phone format GEN: No acute distress.    Respiratory: no shortness of breath   Psych: Normal affect   Labs    High Sensitivity Troponin:   Recent Labs  Lab 06/29/20 1347 06/29/20 1736 06/30/20 0504 07/01/20 0902 07/01/20 1054  TROPONINIHS 2,903* 4,970* 8,270* 2,350* 2,023*      Chemistry Recent Labs  Lab  06/29/20 0706 06/30/20 0504 07/01/20 0838 07/02/20 0210  NA 139 137 136 137  K 4.0 3.6 3.8 3.7  CL 105 105 105 107  CO2 26 25 22 24   GLUCOSE 106* 104* 96 103*  BUN 8 8 10 10   CREATININE 0.89 0.81 0.78 0.99  CALCIUM 8.9 8.7* 8.5* 8.5*  PROT 6.9  --   --   --   ALBUMIN 3.6  --   --   --   AST 23  --   --   --   ALT 20  --   --   --   ALKPHOS 81  --   --   --   BILITOT 0.6  --   --   --   GFRNONAA >60 >60 >60 >60  ANIONGAP 8 7 9 6      Hematology Recent Labs  Lab 06/29/20 0706 07/01/20 0838 07/02/20 0210  WBC 6.3 8.8 9.0  RBC 5.46 4.80 4.67  HGB 15.4 13.5 13.0  HCT 48.3 41.8 40.4  MCV 88.5 87.1 86.5  MCH 28.2 28.1 27.8  MCHC 31.9 32.3 32.2  RDW 13.3 13.6 13.6  PLT 287 280 270    BNPNo results for input(s):  BNP, PROBNP in the last 168 hours.   DDimer No results for input(s): DDIMER in the last 168 hours.   Radiology    No results found.  Cardiac Studies   Normal EF, A1C 5.6  Patient Profile     45 y.o. male elevated troponin  Assessment & Plan    Visit done via phone to minimize exposure and use of PPE.  Elevated troponin: Multiple RF for CAD including family history of coronary disease, tobacco abuse and obesity.  Dr. Wyline Mood explained cardiac cath to the patient.  All questions today were answered.  Further plans based on cath results.  I already talked to him about the importance of staying on Brilinta or clopidogrel if he does get a stent.  Hypertension: Some of his troponin could have been from hypertensive urgency.  Will need to stressed the importance of compliance with medications going forward.  COVID positive status: His test was 7 days ago.  He has been essentially asymptomatic since that time.  No plans for steroids or remdesivir per the internal medicine notes.     For questions or updates, please contact [facility] Please consult [email or web address] for contact info under        Signed, Lance Muss, MD  07/02/2020, 8:50 AM

## 2020-07-02 NOTE — Progress Notes (Addendum)
ANTICOAGULATION CONSULT NOTE - Follow Up Consult  Pharmacy Consult for heparin Indication: NSTEMI  Labs: Recent Labs    06/29/20 0706 06/29/20 0906 06/30/20 0504 06/30/20 1630 07/01/20 0838 07/01/20 0902 07/01/20 1054 07/01/20 1807 07/02/20 0210  HGB 15.4  --   --   --  13.5  --   --   --  13.0  HCT 48.3  --   --   --  41.8  --   --   --  40.4  PLT 287  --   --   --  280  --   --   --  270  HEPARINUNFRC  --    < > <0.10*   < > 0.36  --   --  0.17* 0.75*  CREATININE 0.89  --  0.81  --  0.78  --   --   --  0.99  TROPONINIHS 101*   < > 8,270*  --   --  2,350* 2,023*  --   --    < > = values in this interval not displayed.    Assessment: 45yo male now supratherapeutic on heparin after rate change.  Goal of Therapy:  Heparin level 0.3-0.7 units/ml   Plan:  Will decrease heparin gtt by 1-2 units/kgABW/hr to 2900 units/hr and check level in 6 hours.    Vernard Gambles, PharmD, BCPS  07/02/2020,6:36 AM

## 2020-07-02 NOTE — Progress Notes (Signed)
   07/02/20 1400  AVS Discharge Documentation  AVS Discharge Instructions Including Medications Provided to patient/caregiver  Name of Person Receiving AVS Discharge Instructions Including Medications Delene Ruffini  Name of Clinician That Reviewed AVS Discharge Instructions Including Medications Victorino December RN   Given work note for patient to return to work on 07/08/20

## 2020-07-02 NOTE — TOC Transition Note (Signed)
Transition of Care (TOC) - CM/SW Discharge Note Donn Pierini RN, BSN Transitions of Care Unit 4E- RN Case Manager See Treatment Team for direct phone # Cross coverage for 6E   Patient Details  Name: Travis Beltran MRN: [id number] Date of Birth: March 12, 1976  Transition of Care [facility]) CM/SW Contact:  Darrold Span, RN Phone Number: 07/02/2020, 12:55 PM   Clinical Narrative:    Pt stable for transition home today, per MD request to see pt regarding regarding PCP needs. TC made to pt's room to discuss PCP needs. Per pt he has been going to the [facility] however he would like to find a new primary care provider- discussed with pt what he could do to find new primary care provider- such as calling toll free # or look at website of insurance provider to see who is in network in the area with his insurance. Also provided pt with [facility] # to call for assistance if needed to get assistance with providers that are currently taking new patients. Pt voiced understanding and appreciation for information. Info also placed on AVS for pt.  No other TOC needs noted.    Final next level of care: Home/Self Care Barriers to Discharge: No Barriers Identified   Patient Goals and CMS Choice Patient states their goals for this hospitalization and ongoing recovery are:: return home   Choice offered to / list presented to : NA  Discharge Placement               Home        Discharge Plan and Services   Discharge Planning Services: CM Consult,Other - See comment (info to find new PCP) Post Acute Care Choice: NA          DME Arranged: N/A DME Agency: NA       HH Arranged: NA HH Agency: NA        Social Determinants of Health (SDOH) Interventions     Readmission Risk Interventions Readmission Risk Prevention Plan 07/02/2020  Post Dischage Appt Complete  Medication Screening Complete  Transportation Screening Complete  Some recent data might be hidden

## 2020-07-02 NOTE — Interval H&P Note (Signed)
History and Physical Interval Note:  07/02/2020 9:06 AM  Travis Beltran  has presented today for surgery, with the diagnosis of nstemi.  The various methods of treatment have been discussed with the patient and family. After consideration of risks, benefits and other options for treatment, the patient has consented to  Procedure(s): LEFT HEART CATH AND CORONARY ANGIOGRAPHY (N/A) as a surgical intervention.  The patient's history has been reviewed, patient examined, no change in status, stable for surgery.  I have reviewed the patient's chart and labs.  Questions were answered to the patient's satisfaction.   Cath Lab Visit (complete for each Cath Lab visit)  Clinical Evaluation Leading to the Procedure:   ACS: Yes.    Non-ACS:    Anginal Classification: CCS III  Anti-ischemic medical therapy: Maximal Therapy (2 or more classes of medications)  Non-Invasive Test Results: No non-invasive testing performed  Prior CABG: No previous CABG        Theron Arista [facility] 07/02/2020 9:06 AM

## 2020-07-02 NOTE — Progress Notes (Signed)
Progress Note  Patient Name: Travis Beltran Date of Encounter: 07/02/2020  [facility] Cardiologist: Dina Rich, MD   Subjective   No further chest pain.   No SHOB.  He has not had any sx of cough since Jan 4th when he went to the ER.  Inpatient Medications    Scheduled Meds: . amLODipine  5 mg Oral Daily  . aspirin EC  81 mg Oral Daily  . atorvastatin  40 mg Oral Daily  . carvedilol  25 mg Oral BID WC  . pantoprazole  40 mg Oral Daily  . sodium chloride flush  3 mL Intravenous Q12H   Continuous Infusions: . sodium chloride    . sodium chloride 1 mL/kg/hr (07/02/20 0804)  . heparin 2,900 Units/hr (07/02/20 0809)  . nitroGLYCERIN 45 mcg/min (07/02/20 0731)   PRN Meds: sodium chloride, acetaminophen **OR** acetaminophen, hydrALAZINE, morphine injection, oxyCODONE, sodium chloride flush   Vital Signs    Vitals:   07/01/20 1557 07/01/20 2057 07/01/20 2254 07/02/20 0526  BP: 130/89 (!) 109/42 110/77 127/83  Pulse: 84 88 90 84  Resp: 20 18 (!) 21 14  Temp: 98.7 F (37.1 C) 98.4 F (36.9 C) 98.6 F (37 C) 98 F (36.7 C)  TempSrc: Oral Oral Oral Oral  SpO2:  92% (!) 87% 90%  Weight: (!) 173.3 kg     Height: 6\' 2"  (1.88 m)       Intake/Output Summary (Last 24 hours) at 07/02/2020 0850 Last data filed at 07/01/2020 1622 Gross per 24 hour  Intake 1669.29 ml  Output --  Net 1669.29 ml   Last 3 Weights 07/01/2020 06/29/2020 06/25/2020  Weight (lbs) 382 lb 380 lb 386 lb  Weight (kg) 173.274 kg 172.367 kg 175.088 kg      Telemetry    NSR - Personally Reviewed  ECG    Normal ECG - Personally Reviewed  Physical Exam  Exam limited by phone format GEN: No acute distress.    Respiratory: no shortness of breath   Psych: Normal affect   Labs    High Sensitivity Troponin:   Recent Labs  Lab 06/29/20 1347 06/29/20 1736 06/30/20 0504 07/01/20 0902 07/01/20 1054  TROPONINIHS 2,903* 4,970* 8,270* 2,350* 2,023*      Chemistry Recent Labs  Lab  06/29/20 0706 06/30/20 0504 07/01/20 0838 07/02/20 0210  NA 139 137 136 137  K 4.0 3.6 3.8 3.7  CL 105 105 105 107  CO2 26 25 22 24   GLUCOSE 106* 104* 96 103*  BUN 8 8 10 10   CREATININE 0.89 0.81 0.78 0.99  CALCIUM 8.9 8.7* 8.5* 8.5*  PROT 6.9  --   --   --   ALBUMIN 3.6  --   --   --   AST 23  --   --   --   ALT 20  --   --   --   ALKPHOS 81  --   --   --   BILITOT 0.6  --   --   --   GFRNONAA >60 >60 >60 >60  ANIONGAP 8 7 9 6      Hematology Recent Labs  Lab 06/29/20 0706 07/01/20 0838 07/02/20 0210  WBC 6.3 8.8 9.0  RBC 5.46 4.80 4.67  HGB 15.4 13.5 13.0  HCT 48.3 41.8 40.4  MCV 88.5 87.1 86.5  MCH 28.2 28.1 27.8  MCHC 31.9 32.3 32.2  RDW 13.3 13.6 13.6  PLT 287 280 270    BNPNo results for input(s):  BNP, PROBNP in the last 168 hours.   DDimer No results for input(s): DDIMER in the last 168 hours.   Radiology    No results found.  Cardiac Studies   Normal EF, A1C 5.6  Patient Profile     45 y.o. male elevated troponin  Assessment & Plan    Visit done via phone to minimize exposure and use of PPE.  Elevated troponin: Multiple RF for CAD including family history of coronary disease, tobacco abuse and obesity.  Dr. Wyline Mood explained cardiac cath to the patient.  All questions today were answered.  Further plans based on cath results.  I already talked to him about the importance of staying on Brilinta or clopidogrel if he does get a stent.  Hypertension: Some of his troponin could have been from hypertensive urgency.  Will need to stressed the importance of compliance with medications going forward.  COVID positive status: His test was 7 days ago.  He has been essentially asymptomatic since that time.  No plans for steroids or remdesivir per the internal medicine notes.     For questions or updates, please contact [facility] Please consult [email or web address] for contact info under        Signed, Lance Muss, MD  07/02/2020, 8:50 AM

## 2020-07-02 NOTE — Progress Notes (Signed)
TR BAND REMOVAL  LOCATION:    right radial  DEFLATED PER PROTOCOL:    Yes.    TIME BAND OFF / DRESSING APPLIED:    1230   SITE UPON ARRIVAL:    Level 0  SITE AFTER BAND REMOVAL:    Level 0  CIRCULATION SENSATION AND MOVEMENT:    Within Normal Limits   Yes.    COMMENTS:   Rechecked site at 1300 with no change, dressing dry and intact right radial pulse +2 and no bleeding, bruising or hematoma noted

## 2020-07-02 NOTE — Discharge Instructions (Addendum)

## 2020-07-02 NOTE — Progress Notes (Signed)
PROGRESS NOTE                                                                                                                                                                                                             Patient Demographics:    Travis Beltran, is a 45 y.o. male, DOB - [birth year], ZOX:[id number]  Outpatient Primary MD for the patient is Nida, Denman GeorgeGebreselassie W, MD   Admit date - 06/29/2020   LOS - 3  Chief Complaint  Patient presents with  . Chest Pain    Covid +       Brief Narrative: Patient is a 45 y.o. male with PMHx of HTN-who was diagnosed with COVID-19 on 1/4-presented to the ED on 1/8 with chest pain-found to have non-STEMI-manage medically at [facility]-and [facility].  See below for further details  COVID-19 vaccinated status: Unvaccinated  Significant Events: 1/8>> Admit to [facility] for chest pain-likely non-STEMI. [facility]10>> [facility]  Significant studies: 1/8>> CTA chest: No PE, no pneumonia, asymmetric right gynecomastia 1/9>> Echo: EF 65-70%  COVID-19 medications: None  Antibiotics: None  Microbiology data: None  Procedures: None  Consults: None  DVT prophylaxis: IV heparin    Subjective:    Travis RuffiniDouglas Bristol today remains on room air-no chest pain.  Very comfortable.   Assessment  & Plan :   Non-STEMI: No chest pain-being managed medically-for LHC today-await further recommendations from cardiology.    HTN: BP relatively stable-continue Coreg/amlodipine.  HLD: Continue statin  GERD: Continue PPI  Probable OSA: Encourage patient to follow-up with PCP for outpatient sleep study  Asymmetric gynecomastia: Incidental finding on CT chest-stable for outpatient work-up by PCP  COVID-19 infection: Currently asymptomatic-diagnosed on 1/4-CTA chest without pneumonia-requires for total of 10 days of isolation.  Morbid Obesity: Estimated body  mass index is 49.05 kg/m as calculated from the following:   Height as of this encounter: 6\' 2"  (1.88 m).   Weight as of this encounter: 173.3 kg.   ABG: No results found for: PHART, PCO2ART, PO2ART, HCO3, TCO2, ACIDBASEDEF, O2SAT  Vent Settings: N/A   Condition - Stable  Family Communication  : Patient to update family himself.  Code Status :  Full Code  Diet :  Diet Order    None       Disposition Plan  :  Status is: Inpatient  Remains inpatient appropriate because:IV treatments appropriate due to intensity of illness or inability to take PO and Inpatient level of care appropriate due to severity of illness   Dispo: The patient is from: Home              Anticipated d/c is to: Home              Anticipated d/c date is: 1 day              Patient currently is not medically stable to d/c.   Barriers to discharge: Non-STEMI-awaiting further cardiac work-up including LHC.  Antimicorbials  :    Anti-infectives (From admission, onward)   None      Inpatient Medications  Scheduled Meds: . amLODipine  5 mg Oral Daily  . aspirin EC  81 mg Oral Daily  . atorvastatin  40 mg Oral Daily  . carvedilol  25 mg Oral BID WC  . pantoprazole  40 mg Oral Daily   Continuous Infusions: . heparin Stopped (07/02/20 0920)  . nitroGLYCERIN 45 mcg/min (07/02/20 0731)   PRN Meds:.acetaminophen **OR** acetaminophen, hydrALAZINE, morphine injection, oxyCODONE   Time Spent in minutes  25  See all Orders from today for further details   Jeoffrey MassedShanker Ghimire M.D on 07/02/2020 at 10:53 AM  To page go to [email or web address] - use universal password  [facility] -  Office  [phone]    Objective:   Vitals:   07/02/20 0939 07/02/20 0944 07/02/20 0949 07/02/20 0954  BP: (!) 142/95 (!) 144/96 (!) 138/95 134/86  Pulse: 79 79 75 81  Resp: (!) 25 (!) 26 20 15   Temp:      TempSrc:      SpO2: 96% 96% 96% 97%  Weight:      Height:        Wt Readings from Last 3 Encounters:   07/01/20 (!) 173.3 kg  06/25/20 (!) 175.1 kg  07/04/19 (!) 161.5 kg     Intake/Output Summary (Last 24 hours) at 07/02/2020 1053 Last data filed at 07/01/2020 1622 Gross per 24 hour  Intake 1183.57 ml  Output -  Net 1183.57 ml     Physical Exam Gen Exam:Alert awake-not in any distress HEENT:atraumatic, normocephalic Chest: B/L clear to auscultation anteriorly CVS:S1S2 regular Abdomen:soft non tender, non distended Extremities:no edema Neurology: Non focal Skin: no rash   Data Review:    CBC Recent Labs  Lab 06/29/20 0706 07/01/20 0838 07/02/20 0210  WBC 6.3 8.8 9.0  HGB 15.4 13.5 13.0  HCT 48.3 41.8 40.4  PLT 287 280 270  MCV 88.5 87.1 86.5  MCH 28.2 28.1 27.8  MCHC 31.9 32.3 32.2  RDW 13.3 13.6 13.6    Chemistries  Recent Labs  Lab 06/29/20 0706 06/30/20 0504 07/01/20 0838 07/02/20 0210  NA 139 137 136 137  K 4.0 3.6 3.8 3.7  CL 105 105 105 107  CO2 26 25 22 24   GLUCOSE 106* 104* 96 103*  BUN 8 8 10 10   CREATININE 0.89 0.81 0.78 0.99  CALCIUM 8.9 8.7* 8.5* 8.5*  AST 23  --   --   --   ALT 20  --   --   --   ALKPHOS 81  --   --   --   BILITOT 0.6  --   --   --    ------------------------------------------------------------------------------------------------------------------ No results for input(s): CHOL, HDL, LDLCALC, TRIG, CHOLHDL, LDLDIRECT in the last 72 hours.  Lab Results  Component Value Date   HGBA1C 5.6 06/29/2020   ------------------------------------------------------------------------------------------------------------------ Recent Labs    06/29/20 1257  TSH 1.055   ------------------------------------------------------------------------------------------------------------------ No results for input(s): VITAMINB12, FOLATE, FERRITIN, TIBC, IRON, RETICCTPCT in the last 72 hours.  Coagulation profile No results for input(s): INR, PROTIME in the last 168 hours.  No results for input(s): DDIMER in the last 72  hours.  Cardiac Enzymes No results for input(s): CKMB, TROPONINI, MYOGLOBIN in the last 168 hours.  Invalid input(s): CK ------------------------------------------------------------------------------------------------------------------ No results found for: BNP  Micro Results No results found for this or any previous visit (from the past 240 hour(s)).  Radiology Reports CT Angio Chest PE W and/or Wo Contrast  Result Date: 06/29/2020 CLINICAL DATA:  45 year old male with suspected pulmonary embolism, presenting with chest pain after recent positive COVID test. EXAM: CT ANGIOGRAPHY CHEST WITH CONTRAST TECHNIQUE: Multidetector CT imaging of the chest was performed using the standard protocol during bolus administration of intravenous contrast. Multiplanar CT image reconstructions and MIPs were obtained to evaluate the vascular anatomy. CONTRAST:  100 mL Omnipaque 350, intravenous COMPARISON:  None. FINDINGS: Cardiovascular: Satisfactory opacification of the pulmonary arteries to the segmental level. No evidence of pulmonary embolism. Mild global cardiomegaly. No pericardial effusion. Mediastinum/Nodes: No enlarged mediastinal, hilar, or axillary lymph nodes. Thyroid gland, trachea, and esophagus demonstrate no significant findings. Lungs/Pleura: No focal consolidations. 5 mm pleural based solid pulmonary nodule in the posterior aspect of the anterior segment of the upper lobe. Mild lingular and left basilar subsegmental atelectasis. No pleural effusion or pneumothorax. Upper Abdomen: Diffusely decreased attenuation of the hepatic parenchyma. The visualized upper abdomen is otherwise within normal limits. Musculoskeletal: No acute fracture or aggressive appearing osseous lesion. Asymmetric right gynecomastia. Review of the MIP images confirms the above findings. IMPRESSION: Vascular: No evidence of acute pulmonary embolism.  Mild global cardiomegaly. Non-Vascular: 1. Asymmetric right gynecomastia. 2.  Hepatic steatosis. 3. No evidence of pneumonia. Marliss Coots, MD [facility] Electronically Signed   By: Marliss Coots MD   On: 06/29/2020 10:33   CARDIAC CATHETERIZATION  Result Date: 07/02/2020  LV end diastolic pressure is normal.  1. 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 Normal LV EDP Plan: medical management.   DG Chest Portable 1 View  Result Date: 06/29/2020 CLINICAL DATA:  Chest pain.  COVID positive EXAM: PORTABLE CHEST 1 VIEW COMPARISON:  05/25/2017 FINDINGS: No convincing infiltrate. Generous heart size but accentuated by technique and low volume chest. Extensive artifact from EKG leads. No edema, effusion, or air leak. IMPRESSION: Negative low volume chest. Electronically Signed   By: Marnee Spring M.D.   On: 06/29/2020 07:33   ECHOCARDIOGRAM COMPLETE  Result Date: 06/30/2020    ECHOCARDIOGRAM REPORT   Patient Name:   GABINO HAGIN Date of Exam: 06/30/2020 Medical Rec #:  [id number]          Height:       74.0 in Accession #:    [id number]         Weight:       380.0 lb Date of Birth:  [birth year]         BSA:          2.855 m Patient Age:    44 years           BP:           143/93 mmHg Patient Gender: M  HR:           82 bpm. [facility] Procedure: 2D Echo Indications:    Chest Pain R07.9  History:        Patient has no prior history of Echocardiogram examinations.                 Previous Myocardial Infarction, Signs/Symptoms:Murmur; Risk                 Factors:Hypertension and Current Smoker. Covid 19.  Sonographer:    Jeryl Columbia RDCS (AE) Referring Phys: [id number] [facility] IMPRESSIONS  1. Left ventricular ejection fraction, by estimation, is 65 to 70%. The left ventricle has normal function. The left ventricle has no regional wall motion abnormalities. There is moderate left ventricular hypertrophy. Left ventricular diastolic parameters are indeterminate.  2.  Right ventricular systolic function is normal. The right ventricular size is normal.  3. The mitral valve is normal in structure. Trivial mitral valve regurgitation.  4. The aortic valve is normal in structure. Aortic valve regurgitation is not visualized.  5. The inferior vena cava is normal in size with greater than 50% respiratory variability, suggesting right atrial pressure of 3 mmHg. FINDINGS  Left Ventricle: Left ventricular ejection fraction, by estimation, is 65 to 70%. The left ventricle has normal function. The left ventricle has no regional wall motion abnormalities. The left ventricular internal cavity size was normal in size. There is  moderate left ventricular hypertrophy. Left ventricular diastolic parameters are indeterminate. Right Ventricle: The right ventricular size is normal. Right vetricular wall thickness was not assessed. Right ventricular systolic function is normal. Left Atrium: Left atrial size was normal in size. Right Atrium: Right atrial size was normal in size. Pericardium: There is no evidence of pericardial effusion. Mitral Valve: The mitral valve is normal in structure. Trivial mitral valve regurgitation. Tricuspid Valve: The tricuspid valve is normal in structure. Tricuspid valve regurgitation is trivial. Aortic Valve: The aortic valve is normal in structure. Aortic valve regurgitation is not visualized. Pulmonic Valve: The pulmonic valve was normal in structure. Pulmonic valve regurgitation is not visualized. Aorta: The aortic root is normal in size and structure. Venous: The inferior vena cava is normal in size with greater than 50% respiratory variability, suggesting right atrial pressure of 3 mmHg. IAS/Shunts: The interatrial septum was not assessed.  LEFT VENTRICLE PLAX 2D LVIDd:         4.93 cm  Diastology LVIDs:         2.98 cm  LV e' medial:    11.20 cm/s LV PW:         1.58 cm  LV E/e' medial:  5.2 LV IVS:        1.43 cm  LV e' lateral:   6.85 cm/s LVOT diam:     2.00 cm   LV E/e' lateral: 8.5 LVOT Area:     3.14 cm  RIGHT VENTRICLE RV S prime:     18.20 cm/s TAPSE (M-mode): 2.9 cm LEFT ATRIUM             Index       RIGHT ATRIUM           Index LA diam:        3.80 cm 1.33 cm/m  RA Area:     17.10 cm LA Vol (A2C):   74.8 ml 26.20 ml/m RA Volume:   50.60 ml  17.72 ml/m LA Vol (A4C):   62.2 ml 21.79 ml/m LA  Biplane Vol: 69.5 ml 24.34 ml/m   AORTA Ao Root diam: 3.10 cm MITRAL VALVE MV Area (PHT): 5.58 cm    SHUNTS MV Decel Time: 136 msec    Systemic Diam: 2.00 cm MV E velocity: 58.00 cm/s MV A velocity: 72.20 cm/s MV E/A ratio:  0.80 Dietrich Pates MD Electronically signed by Dietrich Pates MD Signature Date/Time: 06/30/2020/2:01:17 PM    Final

## 2020-07-04 ENCOUNTER — Encounter (HOSPITAL_COMMUNITY): Payer: Self-pay | Admitting: Cardiology

## 2020-08-15 ENCOUNTER — Telehealth: Payer: Self-pay

## 2020-08-15 NOTE — Telephone Encounter (Signed)
Call to pt- no answer unable to leave a vm- tried all number contacts in chart. Per Nida, pt can be scheduled with him.

## 2020-08-19 NOTE — Telephone Encounter (Signed)
Tried to reach patient to schedule. No answer unable to lvm-faxed and notified the PCP

## 2020-10-16 ENCOUNTER — Ambulatory Visit (INDEPENDENT_AMBULATORY_CARE_PROVIDER_SITE_OTHER): Payer: Self-pay | Admitting: Cardiology

## 2020-10-16 ENCOUNTER — Other Ambulatory Visit: Payer: Self-pay

## 2020-10-16 ENCOUNTER — Encounter: Payer: Self-pay | Admitting: Cardiology

## 2020-10-16 VITALS — BP 144/86 | HR 94 | Ht 74.0 in | Wt 366.8 lb

## 2020-10-16 DIAGNOSIS — Z9889 Other specified postprocedural states: Secondary | ICD-10-CM

## 2020-10-16 DIAGNOSIS — I10 Essential (primary) hypertension: Secondary | ICD-10-CM

## 2020-10-16 NOTE — Patient Instructions (Signed)
Medication Instructions:  Your physician recommends that you continue on your current medications as directed. Please refer to the Current Medication list given to you today.  *If you need a refill on your cardiac medications before your next appointment, please call your pharmacy*   Lab Work: None If you have labs (blood work) drawn today and your tests are completely normal, you will receive your results only by: Marland Kitchen MyChart Message (if you have MyChart) OR . A paper copy in the mail If you have any lab test that is abnormal or we need to change your treatment, we will call you to review the results.   Testing/Procedures: None   Follow-Up: At [facility], you and your health needs are our priority.  As part of our continuing mission to provide you with exceptional heart care, we have created designated Provider Care Teams.  These Care Teams include your primary Cardiologist (physician) and Advanced Practice Providers (APPs -  Physician Assistants and Nurse Practitioners) who all work together to provide you with the care you need, when you need it.  We recommend signing up for the patient portal called "MyChart".  Sign up information is provided on this After Visit Summary.  MyChart is used to connect with patients for Virtual Visits (Telemedicine).  Patients are able to view lab/test results, encounter notes, upcoming appointments, etc.  Non-urgent messages can be sent to your provider as well.   To learn more about what you can do with MyChart, go to [email or web address].    Your next appointment:   As needed with Dr. Wyline Mood   Other Instructions

## 2020-10-16 NOTE — Progress Notes (Signed)
Cardiology Office Note  Date: 10/16/2020   ID: Travis Beltran, DOB July 30, 1975, MRN [id number]  PCP:  Shane Crutch, PA  Cardiologist:  Dina Rich, MD Electrophysiologist:  None   Chief Complaint  Patient presents with  . Cardiac follow-up    History of Present Illness: Travis Beltran is a 45 y.o. male patient of Dr. Wyline Mood  inadvertently placed on my schedule today as a new patient.  I reviewed the chart.  He was seen in consultation by Dr. Wyline Mood in January during patient hospitalization with COVID-19, chest discomfort, and abnormal high-sensitivity troponin I up to 8270.  Echocardiography revealed normal LVEF at 65 to 70%.  He underwent cardiac catheterization which demonstrated large epicardial coronary vessels with no evidence of obstructive CAD.  He presents for a follow-up visit today.  Reports no angina symptoms, states that he has been taking his medications and his blood pressure overall has been better controlled with Norvasc.  He works as a Estate agent, sometimes helps unloading boxes.  He feels a soreness in his chest when he does this but it resolves the next day and sounds more inflammatory and musculoskeletal in nature.  PC[facility] with the [facility].  He states that he did end up getting vaccinated after his COVID-19 earlier this year.  Past Medical History:  Diagnosis Date  . Heart murmur   . Hypertension     Past Surgical History:  Procedure Laterality Date  . LEFT HEART CATH AND CORONARY ANGIOGRAPHY N/A 07/02/2020   Procedure: LEFT HEART CATH AND CORONARY ANGIOGRAPHY;  Surgeon: Swaziland, Peter M, MD;  Location: [facility] INVASIVE CV LAB;  Service: Cardiovascular;  Laterality: N/A;    Current Outpatient Medications  Medication Sig Dispense Refill  . amLODipine (NORVASC) 5 MG tablet TAKE 1 TABLET (5 MG TOTAL) BY MOUTH DAILY. 30 tablet 0  . aspirin 81 MG EC tablet TAKE 1 TABLET (81MG  TOTAL) BY MOUTH ONCE DAILY. SWALLOW WHOLE. 30 tablet 0  .  pantoprazole (PROTONIX) 40 MG tablet TAKE 1 TABLET (40 MG TOTAL) BY MOUTH DAILY. 30 tablet 0   No current facility-administered medications for this visit.   Allergies:  Patient has no known allergies.   ROS: No palpitations or syncope.  Physical Exam: VS:  BP (!) 144/86   Pulse 94   Ht 6\' 2"  (1.88 m)   Wt (!) 366 lb 12.8 oz (166.4 kg)   SpO2 96%   BMI 47.09 kg/m , BMI Body mass index is 47.09 kg/m.  Wt Readings from Last 3 Encounters:  10/16/20 (!) 366 lb 12.8 oz (166.4 kg)  07/01/20 (!) 382 lb (173.3 kg)  06/25/20 (!) 386 lb (175.1 kg)    General: Patient appears comfortable at rest. HEENT: Conjunctiva and lids normal, wearing a mask. Neck: Supple, no elevated JVP or carotid bruits, no thyromegaly. Lungs: Clear to auscultation, nonlabored breathing at rest. Cardiac: Regular rate and rhythm, no S3 or significant systolic murmur, no pericardial rub. Extremities: No pitting edema.  ECG:  An ECG dated 06/29/2020 was personally reviewed today and demonstrated:  Sinus rhythm with nonspecific T wave changes.  Recent Labwork: 06/29/2020: ALT 20; AST 23; TSH 1.055 07/02/2020: BUN 10; Creatinine, Ser 0.99; Hemoglobin 13.0; Platelets 270; Potassium 3.7; Sodium 137     Component Value Date/Time   CHOL 130 06/29/2020 0707   TRIG 79 06/29/2020 0707   HDL 30 (L) 06/29/2020 0707   CHOLHDL 4.3 06/29/2020 0707   VLDL 16 06/29/2020 0707   LDLCALC 84 06/29/2020  1751    Other Studies Reviewed Today:  Echocardiogram 06/30/2020: 1. Left ventricular ejection fraction, by estimation, is 65 to 70%. The  left ventricle has normal function. The left ventricle has no regional  wall motion abnormalities. There is moderate left ventricular hypertrophy.  Left ventricular diastolic  parameters are indeterminate.  2. Right ventricular systolic function is normal. The right ventricular  size is normal.  3. The mitral valve is normal in structure. Trivial mitral valve  regurgitation.  4. The  aortic valve is normal in structure. Aortic valve regurgitation is  not visualized.  5. The inferior vena cava is normal in size with greater than 50%  respiratory variability, suggesting right atrial pressure of 3 mmHg.   Cardiac catheterization 07/02/2020:  LV end diastolic pressure is normal.   1. 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 Normal LV EDP  Plan: medical management.  Assessment and Plan:  1.  History of elevated high-sensitivity troponin I levels in January during hospitalization with COVID-19 and hypertensive urgency, most likely demand ischemia rather than myocarditis with LVEF 65 to 70%, and not true ACS given no significant CAD at cardiac catheterization.  He does not describe any angina symptoms.  Would focus on basic risk factor modification and keep follow-up with PCP.  No specific cardiac restrictions at this time.  Cardiac follow-up can be as needed.  2.  Essential hypertension, on Norvasc with follow-up by PCP.  Systolic is in the 140s today.  Medication Adjustments/Labs and Tests Ordered: Current medicines are reviewed at length with the patient today.  Concerns regarding medicines are outlined above.   Tests Ordered: No orders of the defined types were placed in this encounter.   Medication Changes: No orders of the defined types were placed in this encounter.   Disposition:  Follow up as needed with Dr. Wyline Mood.  Signed, Jonelle Sidle, MD, [facility] 10/16/2020 1:39 PM    [facility] at [facility] [address] [place], [place] [address] Phone: [phone]; Fax: [phone]

## 2020-11-06 ENCOUNTER — Telehealth: Payer: Self-pay

## 2020-11-06 NOTE — Telephone Encounter (Signed)
Closed referral and notified PCP- patient has not responded since Feb.

## 2020-12-14 ENCOUNTER — Other Ambulatory Visit: Payer: Self-pay

## 2020-12-14 ENCOUNTER — Emergency Department (HOSPITAL_COMMUNITY)
Admission: EM | Admit: 2020-12-14 | Discharge: 2020-12-14 | Disposition: A | Discharge status: Home / Self Care | Payer: Self-pay | Attending: Emergency Medicine | Admitting: Emergency Medicine

## 2020-12-14 ENCOUNTER — Encounter (HOSPITAL_COMMUNITY): Payer: Self-pay | Admitting: Emergency Medicine

## 2020-12-14 DIAGNOSIS — Z79899 Other long term (current) drug therapy: Secondary | ICD-10-CM | POA: Insufficient documentation

## 2020-12-14 DIAGNOSIS — Z7982 Long term (current) use of aspirin: Secondary | ICD-10-CM | POA: Insufficient documentation

## 2020-12-14 DIAGNOSIS — I10 Essential (primary) hypertension: Secondary | ICD-10-CM | POA: Insufficient documentation

## 2020-12-14 DIAGNOSIS — L301 Dyshidrosis [pompholyx]: Secondary | ICD-10-CM | POA: Insufficient documentation

## 2020-12-14 DIAGNOSIS — F17210 Nicotine dependence, cigarettes, uncomplicated: Secondary | ICD-10-CM | POA: Insufficient documentation

## 2020-12-14 MED ORDER — PREDNISONE 10 MG (21) PO TBPK: ORAL_TABLET | ORAL | 0 refills | Status: DC

## 2020-12-14 NOTE — ED Provider Notes (Signed)
[facility] EMERGENCY DEPARTMENT Provider Note  CSN: [id number] Arrival date & time: 12/14/20 9629    History Chief Complaint  Patient presents with   Rash     Rash  Travis Beltran is a 45 y.o. male with history of HTN on Amlodipine reports 2 days of itchy rash that started on his palms and spread to his feet and lower legs. He has used a new shower gel and he works opening boxes at work. No fever. No prior history of same. No bed bugs, others in household do not have rash.    Past Medical History:  Diagnosis Date   Heart murmur    Hypertension     Past Surgical History:  Procedure Laterality Date   LEFT HEART CATH AND CORONARY ANGIOGRAPHY N/A 07/02/2020   Procedure: LEFT HEART CATH AND CORONARY ANGIOGRAPHY;  Surgeon: Swaziland, Peter M, MD;  Location: [facility] INVASIVE CV LAB;  Service: Cardiovascular;  Laterality: N/A;    Family History  Problem Relation Age of Onset   Cancer Father    Hypertension Father    Hypertension Mother     Social History   Tobacco Use   Smoking status: Every Day    Packs/day: 0.50    Pack years: 0.00    Types: Cigarettes   Smokeless tobacco: Never  Vaping Use   Vaping Use: Never used  Substance Use Topics   Alcohol use: Yes    Comment: occ   Drug use: No     Home Medications Prior to Admission medications   Medication Sig Start Date End Date Taking? Authorizing Provider  predniSONE (STERAPRED UNI-PAK 21 TAB) 10 MG (21) TBPK tablet 10mg  Tabs, 6 day taper. Use as directed 12/14/20  Yes 12/16/20, MD  amLODipine (NORVASC) 5 MG tablet TAKE 1 TABLET (5 MG TOTAL) BY MOUTH DAILY. 07/02/20 07/02/21  Ghimire, 08/30/21, MD  aspirin 81 MG EC tablet TAKE 1 TABLET (81MG  TOTAL) BY MOUTH ONCE DAILY. SWALLOW WHOLE. 07/02/20 07/02/21  Ghimire, 08/30/20, MD  pantoprazole (PROTONIX) 40 MG tablet TAKE 1 TABLET (40 MG TOTAL) BY MOUTH DAILY. 07/02/20 07/02/21  Ghimire, 08/30/20, MD     Allergies    Patient has no known allergies.   Review of  Systems   Review of Systems  Skin:  Positive for rash.  A comprehensive review of systems was completed and negative except as noted in HPI.    Physical Exam BP (!) 170/104 (BP Location: Right Arm)   Pulse 87   Temp 98 F (36.7 C) (Oral)   Resp 18   Ht 6\' 2"  (1.88 m)   Wt (!) 161.5 kg   SpO2 97%   BMI 45.71 kg/m   Physical Exam Vitals and nursing note reviewed.  HENT:     Head: Normocephalic.     Nose: Nose normal.  Eyes:     Extraocular Movements: Extraocular movements intact.  Pulmonary:     Effort: Pulmonary effort is normal.  Musculoskeletal:        General: Normal range of motion.     Cervical back: Neck supple.  Skin:    Findings: Rash (on exposed skin) present.  Neurological:     Mental Status: He is alert and oriented to person, place, and time.  Psychiatric:        Mood and Affect: Mood normal.          ED Results / Procedures / Treatments   Labs (all labs ordered are listed, but only  abnormal results are displayed) Labs Reviewed - No data to display  EKG None  Radiology No results found.  Procedures Procedures  Medications Ordered in the ED Medications - No data to display   MDM Rules/Calculators/A&P MDM   ED Course  I have reviewed the triage vital signs and the nursing notes.  Pertinent labs & imaging results that were available during my care of the patient were reviewed by me and considered in my medical decision making (see chart for details).  Clinical Course as of 12/14/20 0827  Sat Dec 14, 2020  0825 Rash does no fluoresce under Wood's Lamp. Suspect this is dyshidrotic eczema, will give a course of steroids and recommend close PCP follow up if not improved. Benadryl for itching.  [CS]    Clinical Course User Index [CS] Pollyann Savoy, MD    Final Clinical Impression(s) / ED Diagnoses Final diagnoses:  Dyshidrotic eczema    Rx / DC Orders ED Discharge Orders          Ordered    predniSONE (STERAPRED UNI-PAK  21 TAB) 10 MG (21) TBPK tablet        12/14/20 8177             Pollyann Savoy, MD 12/14/20 [phone]

## 2020-12-14 NOTE — ED Triage Notes (Signed)
Patient c/o rash to hands, arms, legs, fett, and buttocks x2 days. 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 Per patient itching. Denies any swelling of face or difficulty breathing.

## 2020-12-27 ENCOUNTER — Encounter (HOSPITAL_COMMUNITY): Payer: Self-pay | Admitting: Emergency Medicine

## 2020-12-27 ENCOUNTER — Other Ambulatory Visit: Payer: Self-pay

## 2020-12-27 ENCOUNTER — Emergency Department (HOSPITAL_COMMUNITY)
Admission: EM | Admit: 2020-12-27 | Discharge: 2020-12-27 | Disposition: A | Discharge status: Home / Self Care | Payer: Self-pay | Attending: Emergency Medicine | Admitting: Emergency Medicine

## 2020-12-27 ENCOUNTER — Ambulatory Visit
Admission: EM | Admit: 2020-12-27 | Discharge: 2020-12-27 | Disposition: A | Discharge status: Home / Self Care | Payer: 59 | Attending: Emergency Medicine | Admitting: Emergency Medicine

## 2020-12-27 DIAGNOSIS — Z5321 Procedure and treatment not carried out due to patient leaving prior to being seen by health care provider: Secondary | ICD-10-CM | POA: Insufficient documentation

## 2020-12-27 DIAGNOSIS — R21 Rash and other nonspecific skin eruption: Secondary | ICD-10-CM

## 2020-12-27 DIAGNOSIS — L299 Pruritus, unspecified: Secondary | ICD-10-CM

## 2020-12-27 MED ORDER — DOXYCYCLINE HYCLATE 100 MG PO CAPS: 100.0000 mg | ORAL_CAPSULE | Freq: Two times a day (BID) | ORAL | 0 refills | Status: DC

## 2020-12-27 MED ORDER — HYDROXYZINE HCL 25 MG PO TABS: 25.0000 mg | ORAL_TABLET | Freq: Four times a day (QID) | ORAL | 0 refills | Status: DC

## 2020-12-27 NOTE — ED Triage Notes (Signed)
Pt presents with rash all over for past month that has not responded to steroids

## 2020-12-27 NOTE — ED Provider Notes (Signed)
Emergency Medicine Provider Triage Evaluation Note  Travis Beltran 45 y.o. male was evaluated in triage.  Pt complains of rash that has been ongoing for last 3 weeks.  He reports a generalized body rash noted to bilateral arms, hands, chest, back, buttocks and legs.  He states he was seen at [facility] had initially onset of symptoms and was given steroids which he states is not provided any relief.  He states it is pruritic.  No fevers.  No new soaps, lotions, detergents.  No known history of allergies.  He has not had any tongue or lip swelling.   Review of Systems  Positive: Rash Negative: Shortness of breath, tongue or lip swelling.  Physical Exam  BP 134/82   Pulse 70   Temp 98.2 F (36.8 C) (Oral)   Resp 18   Ht 5\' 4"  (1.626 m)   Wt 65.8 kg   SpO2 100%   BMI 24.89 kg/m  Gen:   Awake, no distress   HEENT:  Atraumatic  Resp:  Normal effort  Cardiac:  Normal rate  Abd:   Nondistended, nontender  MSK:   Moves extremities without difficulty  Neuro:  Speech clear   Other:   Diffuse maculopapular rash noted  Medical Decision Making  Medically screening exam initiated at 11:32 AM  Appropriate orders placed.  WILMOT QUEVEDO was informed that the remainder of the evaluation will be completed by another provider, this initial triage assessment does not replace that evaluation. They are counseled that they will need to remain in the ED until the completion of their workup, including full H&P and results of any tests.  Risks of leaving the emergency department prior to completion of treatment were discussed. Patient was advised to inform ED staff if they are leaving before their treatment is complete. The patient acknowledged these risks and time was allowed for questions.     The patient appears stable so that the remainder of the MSE may be completed by another provider.    Clinical Impression  Rash.   Portions of this note were generated with Roslyn Smiling.  Dictation errors may occur despite best attempts at proofreading.     Scientist, clinical (histocompatibility and immunogenetics), PA-C 12/27/20 1133    02/27/21, MD 12/29/20 [phone]

## 2020-12-27 NOTE — Discharge Instructions (Addendum)
Wash with warm water and mild soap Doxycycline prescribed to cover for possible infection Continue with steroid Hydroxyzine for itching.  This medication may make you drowsy.  Do not take while driving or operating heavy machinery Follow up with dermatology Return or go to the ER if you have any new or worsening symptoms such as fever, chills, nausea, vomiting, redness, swelling, discharge, if symptoms do not improve with medications, etc..Marland Kitchen

## 2020-12-27 NOTE — ED Provider Notes (Signed)
[facility]   [id number] 12/27/20 Arrival Time: 1439  CC: SKIN COMPLAINT  SUBJECTIVE:  JONHATAN Beltran is a 45 y.o. male who presents with a rash x 2-3 weeks.  Denies precipitating event or trauma.  Denies changes in soaps, detergents, close contacts with similar rash, known trigger or environmental trigger, allergy. Denies medications change or starting a new medication recently.  Does admit [place].  Rash is diffuse about the body.  Describes it as painful, itchy and spreading.  Has tried rx'ed prednisone without relief.  Denies aggravating factors.  Denies similar symptoms in the past.   Denies fever, chills, nausea, vomiting, swelling, discharge.  ROS: As per HPI.  All other pertinent ROS negative.     Past Medical History:  Diagnosis Date   Heart murmur    Hypertension    Past Surgical History:  Procedure Laterality Date   LEFT HEART CATH AND CORONARY ANGIOGRAPHY N/A 07/02/2020   Procedure: LEFT HEART CATH AND CORONARY ANGIOGRAPHY;  Surgeon: Swazil[place], Peter M, MD;  Location: [facility] INVASIVE CV LAB;  Service: Cardiovascular;  Laterality: N/A;   No Known Allergies No current facility-administered medications on file prior to encounter.   Current Outpatient Medications on File Prior to Encounter  Medication Sig Dispense Refill   amLODipine (NORVASC) 5 MG tablet TAKE 1 TABLET (5 MG TOTAL) BY MOUTH DAILY. 30 tablet 0   aspirin 81 MG EC tablet TAKE 1 TABLET (81MG  TOTAL) BY MOUTH ONCE DAILY. SWALLOW WHOLE. 30 tablet 0   pantoprazole (PROTONIX) 40 MG tablet TAKE 1 TABLET (40 MG TOTAL) BY MOUTH DAILY. 30 tablet 0   predniSONE (STERAPRED UNI-PAK 21 TAB) 10 MG (21) TBPK tablet 10mg  Tabs, 6 day taper. Use as directed 1 each 0   Social History   Socioeconomic History   Marital status: Married    Spouse name: Not on file   Number of children: Not on file   Years of education: Not on file   Highest education level: Not on file  Occupational History    Not on file  Tobacco Use   Smoking status: Every Day    Packs/day: 0.50    Pack years: 0.00    Types: Cigarettes   Smokeless tobacco: Never  Vaping Use   Vaping Use: Never used  Substance and Sexual Activity   Alcohol use: Yes    Comment: occ   Drug use: No   Sexual activity: Not on file  Other Topics Concern   Not on file  Social History Narrative   Not on file   Social Determinants of Health   Financial Resource Strain: Not on file  Food Insecurity: Not on file  Transportation Needs: Not on file  Physical Activity: Not on file  Stress: Not on file  Social Connections: Not on file  Intimate Partner Violence: Not on file   Family History  Problem Relation Age of Onset   Cancer Father    Hypertension Father    Hypertension Mother     OBJECTIVE: Vitals:   12/27/20 1623  BP: (!) 146/91  Pulse: 86  Resp: (!) 22  Temp: 99.1 F (37.3 C)  SpO2: 95%    General appearance: alert; no distress Head: NCAT Lungs: clear to auscultation bilaterally Heart: regular rate and rhythm.  Radial pulse 2+ bilaterally Extremities: no edema Skin: warm and dry; erythematous hyperpigmented papular pustular rash diffuse about the extremities, mildly TTP, some clear drainage, blanches with pressure Psychological: alert and cooperative; normal mood and  affect  ASSESSMENT & PLAN:  1. Rash and nonspecific skin eruption   2. Itching     Meds ordered this encounter  Medications   hydrOXYzine (ATARAX/VISTARIL) 25 MG tablet    Sig: Take 1 tablet (25 mg total) by mouth every 6 (six) hours.    Dispense:  12 tablet    Refill:  0    Order Specific Question:   Supervising Provider    Answer:   Eustace Moore [[id number]]   doxycycline (VIBRAMYCIN) 100 MG capsule    Sig: Take 1 capsule (100 mg total) by mouth 2 (two) times daily.    Dispense:  20 capsule    Refill:  0    Order Specific Question:   Supervising Provider    Answer:   Eustace Moore [[id number]]    Wash with warm  water and mild soap Doxycycline prescribed to cover for possible infection Continue with steroid Hydroxyzine for itching.  This medication may make you drowsy.  Do not take while driving or operating heavy machinery Follow up with dermatology Return or go to the ER if you have any new or worsening symptoms such as fever, chills, nausea, vomiting, redness, swelling, discharge, if symptoms do not improve with medications, etc...  Reviewed expectations re: course of current medical issues. Questions answered. Outlined signs and symptoms indicating need for more acute intervention. Patient verbalized understanding. After Visit Summary given.    Travis Harding, PA-C 12/27/20 1656

## 2020-12-27 NOTE — ED Notes (Signed)
Pt left the lobby and stated he was going to urgent care

## 2020-12-27 NOTE — ED Triage Notes (Signed)
Patient complains of an itching rash over arms, hands, chest, back, buttocks, and legs. No oral swelling. Was seen at [facility] and given oral steroid with no relief. Patient alert, oriented, and in no apparent distress at this time.

## 2021-01-31 ENCOUNTER — Other Ambulatory Visit: Payer: Self-pay

## 2021-01-31 ENCOUNTER — Encounter (HOSPITAL_COMMUNITY): Payer: Self-pay | Admitting: *Deleted

## 2021-01-31 ENCOUNTER — Emergency Department (HOSPITAL_COMMUNITY)
Admission: EM | Admit: 2021-01-31 | Discharge: 2021-01-31 | Disposition: A | Discharge status: Home / Self Care | Payer: 59 | Attending: Emergency Medicine | Admitting: Emergency Medicine

## 2021-01-31 DIAGNOSIS — L309 Dermatitis, unspecified: Secondary | ICD-10-CM | POA: Diagnosis not present

## 2021-01-31 DIAGNOSIS — Z7982 Long term (current) use of aspirin: Secondary | ICD-10-CM | POA: Insufficient documentation

## 2021-01-31 DIAGNOSIS — F17210 Nicotine dependence, cigarettes, uncomplicated: Secondary | ICD-10-CM | POA: Diagnosis not present

## 2021-01-31 DIAGNOSIS — R21 Rash and other nonspecific skin eruption: Secondary | ICD-10-CM | POA: Diagnosis present

## 2021-01-31 DIAGNOSIS — I10 Essential (primary) hypertension: Secondary | ICD-10-CM | POA: Diagnosis not present

## 2021-01-31 DIAGNOSIS — Z79899 Other long term (current) drug therapy: Secondary | ICD-10-CM | POA: Diagnosis not present

## 2021-01-31 MED ORDER — CEPHALEXIN 500 MG PO CAPS: 500.0000 mg | ORAL_CAPSULE | Freq: Four times a day (QID) | ORAL | 0 refills | Status: DC

## 2021-01-31 MED ORDER — HYDROXYZINE HCL 25 MG PO TABS: 25.0000 mg | ORAL_TABLET | Freq: Four times a day (QID) | ORAL | 0 refills | Status: DC | PRN

## 2021-01-31 NOTE — ED Provider Notes (Signed)
[facility] EMERGENCY DEPARTMENT Provider Note   CSN: [id number] Arrival date & time: 01/31/21  1211     History Chief Complaint  Patient presents with   Leg Swelling    Travis Beltran is a 45 y.o. male.  Patient with a rash to his legs and hands.  Is pruritic and been there for a while  The history is provided by the patient and medical records. No language interpreter was used.  Rash Location:  Full body Quality: blistering   Severity:  Moderate Duration:  1 month Timing:  Constant Progression:  Worsening Chronicity:  Recurrent Context: not animal contact   Associated symptoms: no abdominal pain, no diarrhea, no fatigue and no headaches       Past Medical History:  Diagnosis Date   Heart murmur    Hypertension     Patient Active Problem List   Diagnosis Date Noted   NSTEMI (non-ST elevated myocardial infarction) (HCC) 06/29/2020    Past Surgical History:  Procedure Laterality Date   LEFT HEART CATH AND CORONARY ANGIOGRAPHY N/A 07/02/2020   Procedure: LEFT HEART CATH AND CORONARY ANGIOGRAPHY;  Surgeon: Swaziland, Peter M, MD;  Location: [facility] INVASIVE CV LAB;  Service: Cardiovascular;  Laterality: N/A;       Family History  Problem Relation Age of Onset   Cancer Father    Hypertension Father    Hypertension Mother     Social History   Tobacco Use   Smoking status: Every Day    Packs/day: 0.50    Types: Cigarettes   Smokeless tobacco: Never  Vaping Use   Vaping Use: Never used  Substance Use Topics   Alcohol use: Yes    Comment: occ   Drug use: No    Home Medications Prior to Admission medications   Medication Sig Start Date End Date Taking? Authorizing Provider  cephALEXin (KEFLEX) 500 MG capsule Take 1 capsule (500 mg total) by mouth 4 (four) times daily. 01/31/21  Yes Bethann Berkshire, MD  hydrOXYzine (ATARAX/VISTARIL) 25 MG tablet Take 1 tablet (25 mg total) by mouth every 6 (six) hours as needed for itching. 01/31/21  Yes Bethann Berkshire, MD   amLODipine (NORVASC) 5 MG tablet TAKE 1 TABLET (5 MG TOTAL) BY MOUTH DAILY. 07/02/20 07/02/21  Ghimire, Werner Lean, MD  aspirin 81 MG EC tablet TAKE 1 TABLET (81MG  TOTAL) BY MOUTH ONCE DAILY. SWALLOW WHOLE. 07/02/20 07/02/21  Ghimire, 08/30/21, MD  doxycycline (VIBRAMYCIN) 100 MG capsule Take 1 capsule (100 mg total) by mouth 2 (two) times daily. 12/27/20   Wurst, 02/27/21, PA-C  pantoprazole (PROTONIX) 40 MG tablet TAKE 1 TABLET (40 MG TOTAL) BY MOUTH DAILY. 07/02/20 07/02/21  Ghimire, 08/30/21, MD  predniSONE (STERAPRED UNI-PAK 21 TAB) 10 MG (21) TBPK tablet 10mg  Tabs, 6 day taper. Use as directed 12/14/20   , MD    Allergies    Patient has no known allergies.  Review of Systems   Review of Systems  Constitutional:  Negative for appetite change and fatigue.  HENT:  Negative for congestion, ear discharge and sinus pressure.   Eyes:  Negative for discharge.  Respiratory:  Negative for cough.   Cardiovascular:  Negative for chest pain.  Gastrointestinal:  Negative for abdominal pain and diarrhea.  Genitourinary:  Negative for frequency and hematuria.  Musculoskeletal:  Negative for back pain.  Skin:  Positive for rash.  Neurological:  Negative for seizures and headaches.  Psychiatric/Behavioral:  Negative for hallucinations.    Physical Exam  Updated Vital Signs BP (!) 148/97 (BP Location: Right Arm)   Pulse 89   Temp 98.3 F (36.8 C) (Oral)   Resp 20   Ht 6\' 2"  (1.88 m)   Wt (!) 170.6 kg   SpO2 96%   BMI 48.28 kg/m   Physical Exam Vitals and nursing note reviewed.  Constitutional:      Appearance: He is well-developed.  HENT:     Head: Normocephalic.     Nose: Nose normal.  Eyes:     General: No scleral icterus.    Conjunctiva/sclera: Conjunctivae normal.  Neck:     Thyroid: No thyromegaly.  Cardiovascular:     Rate and Rhythm: Normal rate and regular rhythm.     Heart sounds: No murmur heard.   No friction rub. No gallop.  Pulmonary:     Breath  sounds: No stridor. No wheezing or rales.  Chest:     Chest wall: No tenderness.  Abdominal:     General: There is no distension.     Tenderness: There is no abdominal tenderness. There is no rebound.  Musculoskeletal:        General: Normal range of motion.     Cervical back: Neck supple.  Lymphadenopathy:     Cervical: No cervical adenopathy.  Skin:    Findings: Erythema and rash present.     Comments: Patient has some vesicles along with his rash throughout his legs  Neurological:     Mental Status: He is alert and oriented to person, place, and time.     Motor: No abnormal muscle tone.     Coordination: Coordination normal.  Psychiatric:        Behavior: Behavior normal.    ED Results / Procedures / Treatments   Labs (all labs ordered are listed, but only abnormal results are displayed) Labs Reviewed - No data to display  EKG None  Radiology No results found.  Procedures Procedures   Medications Ordered in ED Medications - No data to display  ED Course  I have reviewed the triage vital signs and the nursing notes.  Pertinent labs & imaging results that were available during my care of the patient were reviewed by me and considered in my medical decision making (see chart for details).    MDM Rules/Calculators/A&P                           Patient with dermatitis.  Unsure cause.  Patient has been placed on prednisone without help.  He will be placed on Keflex and Vistaril.  And referred to dermatology Final Clinical Impression(s) / ED Diagnoses Final diagnoses:  Dermatitis    Rx / DC Orders ED Discharge Orders          Ordered    cephALEXin (KEFLEX) 500 MG capsule  4 times daily        01/31/21 1503    hydrOXYzine (ATARAX/VISTARIL) 25 MG tablet  Every 6 hours PRN        01/31/21 1503             04/02/21, MD 02/02/21 [phone]

## 2021-01-31 NOTE — Discharge Instructions (Signed)
Follow-up with [facility] dermatology in 2 to 4 weeks.  Or follow-up with your family doctor

## 2021-01-31 NOTE — ED Triage Notes (Signed)
Pt with c/o rash for two months and bilateral leg swelling for a month.  Pt has seen PCP for it and was placed on prednisone, which pt has finished.

## 2021-05-06 ENCOUNTER — Telehealth: Payer: Self-pay | Admitting: Family Medicine

## 2021-05-06 NOTE — Telephone Encounter (Signed)
Called patient to schedule Dermatology appointment LVM, if patient calls back please assist in scheduling.  Appointment Notes: Ref by [facility] (Rash)  Thanks!

## 2021-07-10 ENCOUNTER — Ambulatory Visit
Admission: RE | Admit: 2021-07-10 | Discharge: 2021-07-10 | Disposition: A | Discharge status: Home / Self Care | Payer: 59 | Source: Ambulatory Visit | Attending: Family Medicine | Admitting: Family Medicine

## 2021-07-10 ENCOUNTER — Other Ambulatory Visit: Payer: Self-pay | Admitting: Family Medicine

## 2021-07-10 DIAGNOSIS — R600 Localized edema: Secondary | ICD-10-CM

## 2021-07-29 ENCOUNTER — Other Ambulatory Visit: Payer: Self-pay

## 2021-07-29 ENCOUNTER — Emergency Department (HOSPITAL_COMMUNITY): Payer: 59

## 2021-07-29 ENCOUNTER — Emergency Department (HOSPITAL_COMMUNITY)
Admission: EM | Admit: 2021-07-29 | Discharge: 2021-07-29 | Disposition: A | Discharge status: Home / Self Care | Payer: 59 | Attending: Emergency Medicine | Admitting: Emergency Medicine

## 2021-07-29 ENCOUNTER — Encounter (HOSPITAL_COMMUNITY): Payer: Self-pay

## 2021-07-29 DIAGNOSIS — Y990 Civilian activity done for income or pay: Secondary | ICD-10-CM | POA: Diagnosis not present

## 2021-07-29 DIAGNOSIS — S80811A Abrasion, right lower leg, initial encounter: Secondary | ICD-10-CM | POA: Diagnosis not present

## 2021-07-29 DIAGNOSIS — L03115 Cellulitis of right lower limb: Secondary | ICD-10-CM | POA: Insufficient documentation

## 2021-07-29 DIAGNOSIS — S8991XA Unspecified injury of right lower leg, initial encounter: Secondary | ICD-10-CM | POA: Diagnosis present

## 2021-07-29 DIAGNOSIS — F17200 Nicotine dependence, unspecified, uncomplicated: Secondary | ICD-10-CM | POA: Insufficient documentation

## 2021-07-29 DIAGNOSIS — I10 Essential (primary) hypertension: Secondary | ICD-10-CM | POA: Insufficient documentation

## 2021-07-29 DIAGNOSIS — Z79899 Other long term (current) drug therapy: Secondary | ICD-10-CM | POA: Diagnosis not present

## 2021-07-29 DIAGNOSIS — W1789XA Other fall from one level to another, initial encounter: Secondary | ICD-10-CM | POA: Diagnosis not present

## 2021-07-29 LAB — CBC WITH DIFFERENTIAL/PLATELET
Abs Immature Granulocytes: 0.02 10*3/uL (ref 0.00–0.07)
Basophils Absolute: 0.1 10*3/uL (ref 0.0–0.1)
Basophils Relative: 1 %
Eosinophils Absolute: 0.4 10*3/uL (ref 0.0–0.5)
Eosinophils Relative: 4 %
HCT: 45.6 % (ref 39.0–52.0)
Hemoglobin: 14.8 g/dL (ref 13.0–17.0)
Immature Granulocytes: 0 %
Lymphocytes Relative: 20 %
Lymphs Abs: 1.7 10*3/uL (ref 0.7–4.0)
MCH: 29.1 pg (ref 26.0–34.0)
MCHC: 32.5 g/dL (ref 30.0–36.0)
MCV: 89.8 fL (ref 80.0–100.0)
Monocytes Absolute: 0.6 10*3/uL (ref 0.1–1.0)
Monocytes Relative: 7 %
Neutro Abs: 5.9 10*3/uL (ref 1.7–7.7)
Neutrophils Relative %: 68 %
Platelets: 251 10*3/uL (ref 150–400)
RBC: 5.08 MIL/uL (ref 4.22–5.81)
RDW: 14.1 % (ref 11.5–15.5)
WBC: 8.6 10*3/uL (ref 4.0–10.5)
nRBC: 0 % (ref 0.0–0.2)

## 2021-07-29 LAB — BASIC METABOLIC PANEL
Anion gap: 6 (ref 5–15)
BUN: 8 mg/dL (ref 6–20)
CO2: 26 mmol/L (ref 22–32)
Calcium: 8.6 mg/dL — ABNORMAL LOW (ref 8.9–10.3)
Chloride: 107 mmol/L (ref 98–111)
Creatinine, Ser: 0.8 mg/dL (ref 0.61–1.24)
GFR, Estimated: >60 mL/min (ref >60)
Glucose, Bld: 94 mg/dL (ref 70–99)
Potassium: 4.3 mmol/L (ref 3.5–5.1)
Sodium: 139 mmol/L (ref 135–145)

## 2021-07-29 MED ORDER — CEFAZOLIN SODIUM-DEXTROSE 2-4 GM/100ML-% IV SOLN
2.0000 g | Freq: Once | INTRAVENOUS | Status: AC
  Administered 2021-07-29: 2 g via INTRAVENOUS
  Filled 2021-07-29: qty 100

## 2021-07-29 MED ORDER — DOXYCYCLINE HYCLATE 100 MG PO CAPS
100.0000 mg | ORAL_CAPSULE | Freq: Two times a day (BID) | ORAL | 0 refills | Status: DC
Start: 2021-07-29 — End: 2022-03-26

## 2021-07-29 NOTE — ED Notes (Signed)
Up to restroom.

## 2021-07-29 NOTE — ED Triage Notes (Signed)
Patient reports on 2/3 he fell of truck and right leg got stuck between truck and gas tank.  Complains of right lower leg more in the shin pain.

## 2021-07-29 NOTE — ED Provider Notes (Signed)
[facility] EMERGENCY DEPARTMENT Provider Note   CSN: [id number] Arrival date & time: 07/29/21  0759     History  Chief Complaint  Patient presents with   Leg Injury    Travis Beltran is a 46 y.o. male.  With an injury that occurred at work about a week ago.  Patient got right leg stuck between truck and gas tank.  Complains of right lower leg pain at the shin.  Past medical history significant for hypertension.  Patient taking hypertensive meds.  Patient also had left cath cardiac cath July 02, 2020.  Patient is a current everyday smoker.      Home Medications Prior to Admission medications   Medication Sig Start Date End Date Taking? Authorizing Provider  amLODipine (NORVASC) 5 MG tablet TAKE 1 TABLET (5 MG TOTAL) BY MOUTH DAILY. 07/02/20 07/02/21  Ghimire, Henreitta Leber, MD  cephALEXin (KEFLEX) 500 MG capsule Take 1 capsule (500 mg total) by mouth 4 (four) times daily. 01/31/21   Milton Ferguson, MD  doxycycline (VIBRAMYCIN) 100 MG capsule Take 1 capsule (100 mg total) by mouth 2 (two) times daily. 12/27/20   Wurst, [place], PA-C  hydrOXYzine (ATARAX/VISTARIL) 25 MG tablet Take 1 tablet (25 mg total) by mouth every 6 (six) hours as needed for itching. 01/31/21   Milton Ferguson, MD  pantoprazole (PROTONIX) 40 MG tablet TAKE 1 TABLET (40 MG TOTAL) BY MOUTH DAILY. 07/02/20 07/02/21  Ghimire, Henreitta Leber, MD  predniSONE (STERAPRED UNI-PAK 21 TAB) 10 MG (21) TBPK tablet 10mg  Tabs, 6 day taper. Use as directed 12/14/20   Truddie Hidden, MD      Allergies    Patient has no known allergies.    Review of Systems   Review of Systems  Constitutional:  Negative for chills and fever.  HENT:  Negative for ear pain and sore throat.   Eyes:  Negative for pain and visual disturbance.  Respiratory:  Negative for cough and shortness of breath.   Cardiovascular:  Positive for leg swelling. Negative for chest pain and palpitations.  Gastrointestinal:  Negative for abdominal pain and vomiting.   Genitourinary:  Negative for dysuria and hematuria.  Musculoskeletal:  Negative for arthralgias and back pain.  Skin:  Negative for color change and rash.  Neurological:  Negative for seizures and syncope.  All other systems reviewed and are negative.  Physical Exam Updated Vital Signs BP (!) 143/91 (BP Location: Right Arm)    Pulse 88    Temp 98.5 F (36.9 C) (Oral)    Resp 20    Ht 1.88 m (6\' 2" )    Wt (!) 184.2 kg    SpO2 96%    BMI 52.13 kg/m  Physical Exam Vitals and nursing note reviewed.  Constitutional:      General: He is not in acute distress.    Appearance: Normal appearance. He is well-developed.  HENT:     Head: Normocephalic and atraumatic.  Eyes:     Extraocular Movements: Extraocular movements intact.     Conjunctiva/sclera: Conjunctivae normal.     Pupils: Pupils are equal, round, and reactive to light.  Cardiovascular:     Rate and Rhythm: Normal rate and regular rhythm.     Heart sounds: No murmur heard. Pulmonary:     Effort: Pulmonary effort is normal. No respiratory distress.     Breath sounds: Normal breath sounds.  Abdominal:     Palpations: Abdomen is soft.     Tenderness: There is no abdominal tenderness.  Musculoskeletal:        General: Swelling and tenderness present.     Cervical back: Normal range of motion and neck supple.     Comments: Right lower extremity with erythema and swelling foot to the knee.  Anterior shin with about a 5 cm abrasion.  With increased erythema around that area and some tenderness.  Findings consistent with cellulitis.  Distally dorsalis pedis pulse 1+.  Good cap refill.  Sensation intact.  No swelling of the knee.  Left lower extremity normal.  Skin:    General: Skin is warm and dry.     Capillary Refill: Capillary refill takes less than 2 seconds.  Neurological:     General: No focal deficit present.     Mental Status: He is alert and oriented to person, place, and time.     Cranial Nerves: No cranial nerve deficit.      Sensory: No sensory deficit.     Motor: No weakness.  Psychiatric:        Mood and Affect: Mood normal.    ED Results / Procedures / Treatments   Labs (all labs ordered are listed, but only abnormal results are displayed) Labs Reviewed  CBC WITH DIFFERENTIAL/PLATELET  BASIC METABOLIC PANEL    EKG None  Radiology DG Tibia/Fibula Right  Result Date: 07/29/2021 CLINICAL DATA:  [place] off [place] truck, RIGHT leg got stuck between truck and gas tank, pain RIGHT lower leg EXAM: RIGHT TIBIA AND FIBULA - 2 VIEW COMPARISON:  None FINDINGS: Scattered soft tissue swelling. Osseous mineralization normal. Knee and ankle joint alignments normal. No acute fracture, dislocation, or bone destruction. IMPRESSION: No acute osseous abnormalities. Electronically Signed   By: Lavonia Dana M.D.   On: 07/29/2021 09:17    Procedures Procedures    Medications Ordered in ED Medications - No data to display  ED Course/ Medical Decision Making/ A&P                           Medical Decision Making Amount and/or Complexity of Data Reviewed Labs: ordered. Radiology: ordered.   Right lower extremity with cellulitis.  Due to abrasion that appears to be slightly infected.  No fluctuance to it.  No concern for deep space abscess.  But lots of erythema to the right leg consistent with cellulitis.  X-ray shows no bony abnormality.  But does show lots of soft tissue swelling.   CBC reassuring with a white count of 8.6.  Hemoglobin 14.8.  Basic metabolic panel pending to make sure that patient does not have any unknown diabetes.  No history of it.  We will give a dose of Ancef here and then treat patient with doxycycline at home.  Patient will require follow-up he does have a primary care doctor to follow-up with  Most likely will need work note as well.  Final Clinical Impression(s) / ED Diagnoses Final diagnoses:  Cellulitis of right lower extremity    Rx / DC Orders ED Discharge Orders     None          Fredia Sorrow, MD 07/30/21 [phone]

## 2021-07-29 NOTE — Discharge Instructions (Signed)
The antibiotic doxycycline as directed for the next 7 days.  Also would recommend applying antibiotic ointment to the wound on the leg.  Twice a day.  Make an appointment to follow-up with your doctor for the wound check.  Work note provided to be out of work all week

## 2021-10-13 ENCOUNTER — Ambulatory Visit (INDEPENDENT_AMBULATORY_CARE_PROVIDER_SITE_OTHER): Payer: PRIVATE HEALTH INSURANCE | Admitting: Family Medicine

## 2021-10-13 ENCOUNTER — Encounter: Payer: Self-pay | Admitting: Family Medicine

## 2021-10-13 VITALS — BP 142/102 | HR 90 | Ht 73.0 in | Wt >= 6400 oz

## 2021-10-13 DIAGNOSIS — S81801A Unspecified open wound, right lower leg, initial encounter: Secondary | ICD-10-CM | POA: Diagnosis not present

## 2021-10-13 DIAGNOSIS — E559 Vitamin D deficiency, unspecified: Secondary | ICD-10-CM

## 2021-10-13 DIAGNOSIS — I739 Peripheral vascular disease, unspecified: Secondary | ICD-10-CM | POA: Diagnosis not present

## 2021-10-13 DIAGNOSIS — R7301 Impaired fasting glucose: Secondary | ICD-10-CM | POA: Diagnosis not present

## 2021-10-13 DIAGNOSIS — I10 Essential (primary) hypertension: Secondary | ICD-10-CM | POA: Diagnosis not present

## 2021-10-13 DIAGNOSIS — F17200 Nicotine dependence, unspecified, uncomplicated: Secondary | ICD-10-CM | POA: Diagnosis not present

## 2021-10-13 DIAGNOSIS — L97909 Non-pressure chronic ulcer of unspecified part of unspecified lower leg with unspecified severity: Secondary | ICD-10-CM

## 2021-10-13 DIAGNOSIS — J069 Acute upper respiratory infection, unspecified: Secondary | ICD-10-CM

## 2021-10-13 MED ORDER — GABAPENTIN 100 MG PO CAPS: 100.0000 mg | ORAL_CAPSULE | Freq: Three times a day (TID) | ORAL | 3 refills | Status: DC

## 2021-10-13 MED ORDER — LISINOPRIL 20 MG PO TABS: 40.0000 mg | ORAL_TABLET | Freq: Every day | ORAL | 1 refills | Status: DC

## 2021-10-13 NOTE — Patient Instructions (Addendum)
I appreciate the opportunity to provide care to you today! ?  ?Follow up: 3 weeks ? ?-Labs: please stop by the lab to have your blood drawn (CBC, CMP, TSH, hemoglobin A1c, Vit. D, Lipid profile) ? ?-Referrals today- Vascular surgery ? ?-I increased your lisinopril from 40 mg daily daily ? ?-Continue taking your lasix ? ?-Please pick up your Gabapentin at the pharmacy to help with the pain in your legs ?  ? ? ? ?  ?It was a pleasure to see you and I look forward to continuing to work together on your health and well-being. ?Please do not hesitate to call the office if you need care or have questions about your care. ?  ?Have a wonderful day and week. ?With Gratitude, ?Gilmore Laroche MSN, FNP-BC  ?

## 2021-10-13 NOTE — Progress Notes (Addendum)
? ?New Patient Office Visit ? ?Subjective:  ?Patient ID: Travis Beltran, male    DOB: 08-10-1975  Age: 46 y.o. MRN: [id number] ? ?CC:  ?Chief Complaint  ?Patient presents with  ? New Patient (Initial Visit)  ?  Establishing care was previously seen in [facility].   ? Leg Swelling  ?  Right leg pain from the swelling, as well as leaking, complains of having trouble walking due to this, also has swelling on left leg,  has been ongoing for 3 months now.   ? ? ?HPI ?Travis Beltran is a 46 y.o. male presenting today for establishing care. The patient reports bilateral lower extremities edema with a noted ulcer on the right foot. Lower extremity edema started while taking amlodipine and has not resolved since stopping treatments. He reports minimal relief with lasix and leg elevation. He was recently treated with doxycycline for cellulitis of the lower extremities after an injury at work. He completed the treatment but reported that the wound would not heal and has been draining clear fluids and has been present for three months. He denies a history of diabetes. He reports tightness of the LLE with pain with ambulation. The patient reports having  3-4 urine outputs daily while taking laxis with chronic hypertension. He mentions that his BP remains elevated despite treatments. Travis Beltran is a daily smoker that admitted to smoking one pack of cigarettes daily. ? ?He reports nasal congestion and post-nasal drip cough of white phlegm for a month. He denies sinus pain, pressure, fever, nausea, vomiting, and recent sick contacts. ?  ?Past Medical History:  ?Diagnosis Date  ? Heart murmur   ? Hypertension   ? ? ?Past Surgical History:  ?Procedure Laterality Date  ? LEFT HEART CATH AND CORONARY ANGIOGRAPHY N/A 07/02/2020  ? Procedure: LEFT HEART CATH AND CORONARY ANGIOGRAPHY;  Surgeon: Martinique, Peter M, MD;  Location: [facility];  Service: Cardiovascular;  Laterality: N/A;  ? ? ?Family History   ?Problem Relation Age of Onset  ? Cancer Father   ? Hypertension Father   ? Hypertension Mother   ? ? ?Social History  ? ?Socioeconomic History  ? Marital status: Married  ?  Spouse name: Not on file  ? Number of children: Not on file  ? Years of education: Not on file  ? Highest education level: Not on file  ?Occupational History  ? Not on file  ?Tobacco Use  ? Smoking status: Every Day  ?  Packs/day: 0.50  ?  Types: Cigarettes  ? Smokeless tobacco: Never  ?Vaping Use  ? Vaping Use: Never used  ?Substance and Sexual Activity  ? Alcohol use: Yes  ?  Comment: occ  ? Drug use: No  ? Sexual activity: Not on file  ?Other Topics Concern  ? Not on file  ?Social History Narrative  ? Not on file  ? ?Social Determinants of Health  ? ?Financial Resource Strain: Not on file  ?Food Insecurity: Not on file  ?Transportation Needs: Not on file  ?Physical Activity: Not on file  ?Stress: Not on file  ?Social Connections: Not on file  ?Intimate Partner Violence: Not on file  ? ? ?ROS ?Review of Systems  ?Constitutional:  Negative for chills, fatigue and fever.  ?HENT:  Positive for postnasal drip. Negative for sinus pressure and sinus pain.   ?Eyes:  Negative for pain, redness and itching.  ?Respiratory:  Negative for chest tightness and shortness of breath.   ?Cardiovascular:  Negative for chest pain and palpitations.  ?Gastrointestinal:  Negative for constipation, diarrhea, nausea and vomiting.  ?Endocrine: Negative for polydipsia, polyphagia and polyuria.  ?Genitourinary:  Negative for frequency and urgency.  ?Musculoskeletal:  Positive for back pain. Negative for neck pain.  ?Skin:  Positive for wound.  ?     Leakign clear white drainage, with no odor  ?Neurological:  Negative for dizziness, weakness and headaches.  ? ?Objective:  ? ?Today's Vitals: BP (!) 142/102 (BP Location: Left Arm, Patient Position: Sitting)   Pulse 90   Ht 6' 1" (1.854 m)   Wt (!) 403 lb 3.2 oz (182.9 kg)   SpO2 95%   BMI 53.20 kg/m?  ? ?Physical  Exam ?Constitutional:   ?   Appearance: Normal appearance.  ?HENT:  ?   Head: Normocephalic.  ?   Right Ear: External ear normal.  ?   Left Ear: External ear normal.  ?   Nose: Congestion present. No rhinorrhea.  ?   Comments: Postnasal drip ?   Mouth/Throat:  ?   Mouth: Mucous membranes are moist.  ?Eyes:  ?   General:     ?   Right eye: No discharge.     ?   Left eye: No discharge.  ?   Extraocular Movements: Extraocular movements intact.  ?   Pupils: Pupils are equal, round, and reactive to light.  ?Cardiovascular:  ?   Rate and Rhythm: Normal rate and regular rhythm.  ?   Pulses: Normal pulses.  ?   Heart sounds: Normal heart sounds.  ?Pulmonary:  ?   Effort: Pulmonary effort is normal.  ?   Breath sounds: Normal breath sounds.  ?Abdominal:  ?   Tenderness: There is no abdominal tenderness. There is no guarding.  ?Musculoskeletal:     ?   General: Swelling and tenderness present.  ?   Cervical back: Normal range of motion. No rigidity or tenderness.  ?   Right lower leg: Tenderness present. 3+ Edema present.  ?   Left lower leg: Tenderness present. 3+ Edema present.  ?   Comments: Anterior shin with about a 5 cm non-healing wound, with no redness, odor, or pus noted. Distally dorsalis pedis pulse 1+ bilaterally. Sensation intact with thickened toenails.  ?Loss of hair on lower extremities with mottle skin and bluish discoloration.  ?  ?Skin: ?   Capillary Refill: Capillary refill takes less than 2 seconds.  ?   Findings: Lesion and rash present.  ?Neurological:  ?   Mental Status: He is alert and oriented to person, place, and time.  ?Psychiatric:  ?   Comments: Normal affect  ? ? ?Assessment & Plan:  ? ?Problem List Items Addressed This Visit   ? ?  ? Cardiovascular and Mediastinum  ? Peripheral vascular disease ([place])  ?   -Lower extremity edema likely due to PVD ?-Referral to Vascular surgery for further evaluation ?-Neurontin for leg pain ? ?  ?  ? Relevant Medications  ? furosemide (LASIX) 40 MG tablet  ?  lisinopril (ZESTRIL) 20 MG tablet  ? Essential hypertension  ?  -Elevated BP likely due to the patient's hurting (pain) ?-Pt reports elevated ambulatory BP despite treatments ?-lisinopril increased to 37m ?-Continue treatment regimen ? ?  ?  ? Relevant Medications  ? furosemide (LASIX) 40 MG tablet  ? lisinopril (ZESTRIL) 20 MG tablet  ?  ? Respiratory  ? URI (upper respiratory infection)  ?  -Encouraged supportive treatment with mucinex and symptomatic  management. ? ?  ?  ?  ? Other  ? Wound of lower extremity, right, initial encounter  ?  -No signs of infection noted ?-Encouraged aseptic wound care ? ?  ?  ? Relevant Medications  ? gabapentin (NEURONTIN) 100 MG capsule  ? Other Relevant Orders  ? CBC with Differential/Platelet  ? CMP14+EGFR  ? Lipid panel  ? TSH + free T4  ? Smoking  ?  Smokes about 1 pack/day ? ?Asked about quitting: confirms that he/she currently smokes cigarettes ?Advise to quit smoking: Educated about QUITTING to reduce the risk of cancer, cardio and cerebrovascular disease. ?Assess willingness: Unwilling to quit at this time, but is working on cutting back. ?Assist with counseling and pharmacotherapy: Counseled for 5 minutes and literature provided. ?Arrange for follow up: follow up in 3 months and continue to offer help. ?  ?  ? ?Other Visit Diagnoses   ? ? IFG (impaired fasting glucose)    -  Primary  ? Relevant Orders  ? Hemoglobin A1C  ? Vitamin D deficiency      ? Relevant Orders  ? Vitamin D (25 hydroxy)  ? 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 Relevant Medications  ? furosemide (LASIX) 40 MG tablet  ? lisinopril (ZESTRIL) 20 MG tablet  ? Other Relevant Orders  ? Ambulatory referral to Vascular Surgery  ? ?  ? ? ?Outpatient Encounter Medications as of 10/13/2021  ?Medication Sig  ? cephALEXin (KEFLEX) 500 MG capsule Take 1 capsule (500 mg total) by mouth 4 (four) times daily.  ? furosemide (LASIX) 40 MG tablet Take 80 mg by mouth every morning.  ?  gabapentin (NEURONTIN) 100 MG capsule Take 1 capsule (100 mg total) by mouth 3 (three) times daily.  ? potassium chloride (KLOR-CON) 20 MEQ packet Take 20 mEq by mouth daily.  ? [DISCONTINUED] lisinopril (ZESTRIL)

## 2021-10-14 DIAGNOSIS — F17200 Nicotine dependence, unspecified, uncomplicated: Secondary | ICD-10-CM | POA: Insufficient documentation

## 2021-10-14 DIAGNOSIS — I739 Peripheral vascular disease, unspecified: Secondary | ICD-10-CM | POA: Insufficient documentation

## 2021-10-14 DIAGNOSIS — J069 Acute upper respiratory infection, unspecified: Secondary | ICD-10-CM | POA: Insufficient documentation

## 2021-10-14 DIAGNOSIS — I10 Essential (primary) hypertension: Secondary | ICD-10-CM | POA: Insufficient documentation

## 2021-10-14 DIAGNOSIS — S81801A Unspecified open wound, right lower leg, initial encounter: Secondary | ICD-10-CM | POA: Insufficient documentation

## 2021-10-14 NOTE — Assessment & Plan Note (Signed)
-  No signs of infection noted ?-Encouraged aseptic wound care ?

## 2021-10-14 NOTE — Assessment & Plan Note (Signed)
Smokes about 1 pack/day  Asked about quitting: confirms that he/she currently smokes cigarettes Advise to quit smoking: Educated about QUITTING to reduce the risk of cancer, cardio and cerebrovascular disease. Assess willingness: Unwilling to quit at this time, but is working on cutting back. Assist with counseling and pharmacotherapy: Counseled for 5 minutes and literature provided. Arrange for follow up: follow up in 3 months and continue to offer help. 

## 2021-10-14 NOTE — Assessment & Plan Note (Addendum)
-  Elevated BP likely due to the patient's hurting (pain) ?-Pt reports elevated ambulatory BP despite treatments ?-lisinopril increased to 20mg  ?-Continue treatment regimen ?

## 2021-10-14 NOTE — Assessment & Plan Note (Signed)
-  Lower extremity edema likely due to PVD ?-Referral to Vascular surgery for further evaluation ?-Neurontin for leg pain ?

## 2021-10-14 NOTE — Assessment & Plan Note (Signed)
-  Encouraged supportive treatment with mucinex and symptomatic management. ?

## 2021-10-15 ENCOUNTER — Ambulatory Visit (INDEPENDENT_AMBULATORY_CARE_PROVIDER_SITE_OTHER): Payer: PRIVATE HEALTH INSURANCE

## 2021-10-15 ENCOUNTER — Ambulatory Visit (INDEPENDENT_AMBULATORY_CARE_PROVIDER_SITE_OTHER): Payer: PRIVATE HEALTH INSURANCE | Admitting: Vascular Surgery

## 2021-10-15 ENCOUNTER — Other Ambulatory Visit (HOSPITAL_COMMUNITY): Payer: Self-pay | Admitting: Vascular Surgery

## 2021-10-15 ENCOUNTER — Encounter: Payer: Self-pay | Admitting: Vascular Surgery

## 2021-10-15 DIAGNOSIS — I739 Peripheral vascular disease, unspecified: Secondary | ICD-10-CM | POA: Diagnosis not present

## 2021-10-15 DIAGNOSIS — R600 Localized edema: Secondary | ICD-10-CM

## 2021-10-15 LAB — CBC WITH DIFFERENTIAL/PLATELET

## 2021-10-15 LAB — CMP14+EGFR
ALT: 11 IU/L (ref 0–44)
AST: 16 IU/L (ref 0–40)
Albumin/Globulin Ratio: 1.7 (ref 1.2–2.2)
Albumin: 4.3 g/dL (ref 4.0–5.0)
Alkaline Phosphatase: 106 IU/L (ref 44–121)
BUN/Creatinine Ratio: 6 — ABNORMAL LOW (ref 9–20)
BUN: 6 mg/dL (ref 6–24)
Bilirubin Total: 0.4 mg/dL (ref 0.0–1.2)
CO2: 16 mmol/L — ABNORMAL LOW (ref 20–29)
Calcium: 9.5 mg/dL (ref 8.7–10.2)
Chloride: 104 mmol/L (ref 96–106)
Creatinine, Ser: 0.95 mg/dL (ref 0.76–1.27)
Globulin, Total: 2.6 g/dL (ref 1.5–4.5)
Glucose: 85 mg/dL (ref 70–99)
Potassium: 5 mmol/L (ref 3.5–5.2)
Sodium: 143 mmol/L (ref 134–144)
Total Protein: 6.9 g/dL (ref 6.0–8.5)
eGFR: 101 mL/min/{1.73_m2} (ref >59)

## 2021-10-15 LAB — TSH+FREE T4
Free T4: 1.34 ng/dL (ref 0.82–1.77)
TSH: 1.66 u[IU]/mL (ref 0.450–4.500)

## 2021-10-15 LAB — LIPID PANEL
Chol/HDL Ratio: 3.6 ratio (ref 0.0–5.0)
Cholesterol, Total: 148 mg/dL (ref 100–199)
HDL: 41 mg/dL (ref >39)
LDL Chol Calc (NIH): 90 mg/dL (ref 0–99)
Triglycerides: 87 mg/dL (ref 0–149)
VLDL Cholesterol Cal: 17 mg/dL (ref 5–40)

## 2021-10-15 LAB — HEMOGLOBIN A1C

## 2021-10-15 LAB — VITAMIN D 25 HYDROXY (VIT D DEFICIENCY, FRACTURES): Vit D, 25-Hydroxy: 22.6 ng/mL — ABNORMAL LOW (ref 30.0–100.0)

## 2021-10-15 NOTE — Progress Notes (Signed)
? ? ?[facility] [place] ? ?Patient name: Travis Beltran MRN: [id number] DOB: 1975/09/25 Sex: male ? ?REASON FOR CONSULT: Evaluation arterial flow to lower extremities right leg ? ? ?HPI: ?Travis Beltran is a 46 y.o. male, who is here today to assure adequate arterial flow for healing of right leg wound.  He reports that he scratched this on a truck.  It is the right pretibial area.  He has had slow healing with this.  He does have significant bilateral lower extremity edema and has weeping of serous fluid from his wound.  Any history of claudication or rest pain. ? ?Past Medical History:  ?Diagnosis Date  ? Heart murmur   ? Hypertension   ? ? ?Family History  ?Problem Relation Age of Onset  ? Cancer Father   ? Hypertension Father   ? Hypertension Mother   ? ? ?SOCIAL HISTORY: ?Social History  ? ?Socioeconomic History  ? Marital status: Married  ?  Spouse name: Not on file  ? Number of children: Not on file  ? Years of education: Not on file  ? Highest education level: Not on file  ?Occupational History  ? Not on file  ?Tobacco Use  ? Smoking status: Every Day  ?  Packs/day: 0.50  ?  Types: Cigarettes  ? Smokeless tobacco: Never  ?Vaping Use  ? Vaping Use: Never used  ?Substance and Sexual Activity  ? Alcohol use: Yes  ?  Comment: occ  ? Drug use: No  ? Sexual activity: Not on file  ?Other Topics Concern  ? Not on file  ?Social History Narrative  ? Not on file  ? ?Social Determinants of Health  ? ?Financial Resource Strain: Not on file  ?Food Insecurity: Not on file  ?Transportation Needs: Not on file  ?Physical Activity: Not on file  ?Stress: Not on file  ?Social Connections: Not on file  ?Intimate Partner Violence: Not on file  ? ? ?Allergies  ?Allergen Reactions  ? Amlodipine Besylate Rash  ? ? ?Current Outpatient Medications  ?Medication Sig Dispense Refill  ? amLODipine (NORVASC) 5 MG tablet TAKE 1 TABLET (5 MG TOTAL) BY MOUTH DAILY. (Patient not  taking: Reported on 10/13/2021) 30 tablet 0  ? cephALEXin (KEFLEX) 500 MG capsule Take 1 capsule (500 mg total) by mouth 4 (four) times daily. 28 capsule 0  ? doxycycline (VIBRAMYCIN) 100 MG capsule Take 1 capsule (100 mg total) by mouth 2 (two) times daily. (Patient not taking: Reported on 10/13/2021) 20 capsule 0  ? doxycycline (VIBRAMYCIN) 100 MG capsule Take 1 capsule (100 mg total) by mouth 2 (two) times daily. (Patient not taking: Reported on 10/13/2021) 14 capsule 0  ? furosemide (LASIX) 40 MG tablet Take 80 mg by mouth every morning.    ? gabapentin (NEURONTIN) 100 MG capsule Take 1 capsule (100 mg total) by mouth 3 (three) times daily. 90 capsule 3  ? hydrOXYzine (ATARAX/VISTARIL) 25 MG tablet Take 1 tablet (25 mg total) by mouth every 6 (six) hours as needed for itching. (Patient not taking: Reported on 10/13/2021) 30 tablet 0  ? lisinopril (ZESTRIL) 20 MG tablet Take 2 tablets (40 mg total) by mouth daily. 90 tablet 1  ? pantoprazole (PROTONIX) 40 MG tablet TAKE 1 TABLET (40 MG TOTAL) BY MOUTH DAILY. 30 tablet 0  ? potassium chloride (KLOR-CON) 20 MEQ packet Take 20 mEq by mouth daily.    ? predniSONE (STERAPRED UNI-PAK 21 TAB) 10 MG (21) TBPK tablet 10mg  Tabs,  6 day taper. Use as directed (Patient not taking: Reported on 10/13/2021) 1 each 0  ? ?No current facility-administered medications for this visit.  ? ? ?REVIEW OF SYSTEMS:  ?[X]  denotes positive finding, [ ]  denotes negative finding ?Cardiac  Comments:  ?Chest pain or chest pressure:    ?Shortness of breath upon exertion:    ?Short of breath when lying flat:    ?Irregular heart rhythm:    ?    ?Vascular    ?Pain in calf, thigh, or hip brought on by ambulation:    ?Pain in feet at night that wakes you up from your sleep:     ?Blood clot in your veins:    ?Leg swelling:     ?    ?Pulmonary    ?Oxygen at home:    ?Productive cough:     ?Wheezing:     ?    ?Neurologic    ?Sudden weakness in arms or legs:     ?Sudden numbness in arms or legs:     ?Sudden  onset of difficulty speaking or slurred speech:    ?Temporary loss of vision in one eye:     ?Problems with dizziness:     ?    ?Gastrointestinal    ?Blood in stool:     ?Vomited blood:     ?    ?Genitourinary    ?Burning when urinating:     ?Blood in urine:    ?    ?Psychiatric    ?Major depression:     ?    ?Hematologic    ?Bleeding problems:    ?Problems with blood clotting too easily:    ?    ?Skin    ?Rashes or ulcers:    ?    ?Constitutional    ?Fever or chills:    ? ? ?PHYSICAL EXAM: ?There were no vitals filed for this visit. ? ?GENERAL: The patient is a well-nourished male, in no acute distress. The vital signs are documented above. ?CARDIOVASCULAR: 2+ dorsalis pedis pulse bilaterally. ?PULMONARY: There is good air exchange  ?MUSCULOSKELETAL: There are no major deformities or cyanosis. ?NEUROLOGIC: No focal weakness or paresthesias are detected. ?SKIN: There are no ulcers or rashes noted. ?PSYCHIATRIC: The patient has a normal affect. ? ?DATA:  ?Noninvasive studies revealed probable medial calcification with elevated ankle arm indices.  He has normal triphasic flow bilaterally ? ?MEDICAL ISSUES: ?No evidence of arterial insufficiency lower extremities bilaterally.  He does have significant bilateral lower extremity edema which is making it difficult for him to heal this wound.  I explained to him the importance of diuresis, elevation and also recommended that he begin wearing knee-high graduated compression garments 20 to 30 mmHg.  I explained that he could purchase these at [facility]. ? ?I did explain that he is showing some evidence of arterial disease and that this in large part is related to cigarette smoking.  He does have some desire to quit smoking and will discuss this with his primary care provider.  He will see again on an as-needed basis ? ? ?[facility], MD FACS ?[facility] [place] ?Office Tel [phone] ?Pager [phone] ? ?Note: Portions of this  report may have been transcribed using voice recognition software.  Every effort has been made to ensure accuracy; however, inadvertent computerized transcription errors may still be present. ? ?

## 2021-10-31 ENCOUNTER — Telehealth: Payer: Self-pay | Admitting: *Deleted

## 2021-10-31 NOTE — Telephone Encounter (Signed)
error 

## 2021-11-03 ENCOUNTER — Ambulatory Visit (INDEPENDENT_AMBULATORY_CARE_PROVIDER_SITE_OTHER): Payer: PRIVATE HEALTH INSURANCE | Admitting: Family Medicine

## 2021-11-03 ENCOUNTER — Encounter: Payer: Self-pay | Admitting: Family Medicine

## 2021-11-03 VITALS — BP 160/110 | HR 90 | Ht 74.0 in | Wt >= 6400 oz

## 2021-11-03 DIAGNOSIS — Z1211 Encounter for screening for malignant neoplasm of colon: Secondary | ICD-10-CM | POA: Diagnosis not present

## 2021-11-03 DIAGNOSIS — I10 Essential (primary) hypertension: Secondary | ICD-10-CM

## 2021-11-03 DIAGNOSIS — J309 Allergic rhinitis, unspecified: Secondary | ICD-10-CM | POA: Insufficient documentation

## 2021-11-03 DIAGNOSIS — Z0189 Encounter for other specified special examinations: Secondary | ICD-10-CM

## 2021-11-03 DIAGNOSIS — G4730 Sleep apnea, unspecified: Secondary | ICD-10-CM | POA: Insufficient documentation

## 2021-11-03 DIAGNOSIS — G4739 Other sleep apnea: Secondary | ICD-10-CM

## 2021-11-03 MED ORDER — FLUTICASONE PROPIONATE 50 MCG/ACT NA SUSP: 2.0000 | Freq: Every day | NASAL | 6 refills | Status: DC

## 2021-11-03 MED ORDER — OLMESARTAN MEDOXOMIL 40 MG PO TABS: 40.0000 mg | ORAL_TABLET | Freq: Every day | ORAL | 0 refills | Status: DC

## 2021-11-03 MED ORDER — BENZONATATE 100 MG PO CAPS: 100.0000 mg | ORAL_CAPSULE | Freq: Two times a day (BID) | ORAL | 0 refills | Status: DC | PRN

## 2021-11-03 MED ORDER — METHYLPREDNISOLONE 4 MG PO TBPK: ORAL_TABLET | ORAL | 0 refills | Status: DC
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
## 2021-11-03 NOTE — Assessment & Plan Note (Signed)
-  will start a short dose of medrol dose pack to decrease inflammation associated with sinusitis ?- will start a trial of Tessalon perles to help with cough ?- flonase to help relieve nasal congestion ? ?

## 2021-11-03 NOTE — Patient Instructions (Addendum)
I appreciate the opportunity to provide care to you today! ? ?  ?Follow up:  1 week ? ?Please pick up your medication at the pharmacy ? ?Referral to GI: colonoscopy  ? ?I have placed an order for sleep study, they will set the equipment at your home.  ? ?  ?Please continue to a heart-healthy diet and increase your physical activities. Try to exercise for 69mins at least three times a week.  ? ? ?  ?It was a pleasure to see you and I look forward to continuing to work together on your health and well-being. ?Please do not hesitate to call the office if you need care or have questions about your care. ?  ?Have a wonderful day and week. ?With Gratitude, ?[name] MSN, FNP-BC ? ?

## 2021-11-03 NOTE — Assessment & Plan Note (Addendum)
-   uncontrolled ?-elevated BP today 160/110 ?-reports eating foods high in salt on 11/02/21 ?-will d/c lisinopril and start omistartin 40mg  ?-if BP stays elevated, will start olmesartan amlodipine ?-If BP stays elevated, will start olmesartan/amlodipine/hctz ?-encouraged decreasing salt intake and increasing his physical activities. ?-will reassess BP in 1 week ?

## 2021-11-03 NOTE — Assessment & Plan Note (Signed)
-  Patient's symptoms reported is consistent with sleep apnea ? -Home sleep study ordered ?

## 2021-11-10 ENCOUNTER — Ambulatory Visit: Payer: PRIVATE HEALTH INSURANCE | Admitting: Family Medicine

## 2021-11-18 ENCOUNTER — Encounter: Payer: Self-pay | Admitting: *Deleted

## 2022-02-26 ENCOUNTER — Ambulatory Visit (INDEPENDENT_AMBULATORY_CARE_PROVIDER_SITE_OTHER): Payer: 59 | Admitting: Nurse Practitioner

## 2022-02-26 ENCOUNTER — Encounter: Payer: Self-pay | Admitting: Nurse Practitioner

## 2022-02-26 VITALS — BP 158/103 | HR 82 | Ht 74.0 in | Wt >= 6400 oz

## 2022-02-26 DIAGNOSIS — I10 Essential (primary) hypertension: Secondary | ICD-10-CM

## 2022-02-26 DIAGNOSIS — J309 Allergic rhinitis, unspecified: Secondary | ICD-10-CM

## 2022-02-26 DIAGNOSIS — M25512 Pain in left shoulder: Secondary | ICD-10-CM | POA: Diagnosis not present

## 2022-02-26 DIAGNOSIS — Z2821 Immunization not carried out because of patient refusal: Secondary | ICD-10-CM | POA: Diagnosis not present

## 2022-02-26 MED ORDER — LISINOPRIL 40 MG PO TABS: 40.0000 mg | ORAL_TABLET | Freq: Every day | ORAL | 0 refills | Status: DC

## 2022-02-26 MED ORDER — IBUPROFEN 600 MG PO TABS: 600.0000 mg | ORAL_TABLET | Freq: Three times a day (TID) | ORAL | 0 refills | Status: DC | PRN

## 2022-02-26 MED ORDER — FLUTICASONE PROPIONATE 50 MCG/ACT NA SUSP: 2.0000 | Freq: Every day | NASAL | 6 refills | Status: DC

## 2022-02-26 MED ORDER — CYCLOBENZAPRINE HCL 5 MG PO TABS: 5.0000 mg | ORAL_TABLET | Freq: Three times a day (TID) | ORAL | 1 refills | Status: DC | PRN

## 2022-02-26 NOTE — Assessment & Plan Note (Signed)
Rx ibuprofen 600 mg 3 times daily as needed Rx Flexeril 5 mg  3 times daily as needed Patient told not to take Flexeril when operating machinery or driving due to risk of drowsiness Use of heat and stretching exercises encouraged

## 2022-02-26 NOTE — Assessment & Plan Note (Signed)
BP Readings from Last 3 Encounters:  02/26/22 (!) 158/103  11/03/21 (!) 160/110  10/15/21 (!) 165/118  Chronic condition uncontrolled currently on lisinopril 20 mg daily Start lisinopril 40 mg daily DASH diet advised engage in regular moderate exercises at least 150 minutes weekly BMP in 2 weeks Follow-up in 4 weeks BP goal is less than 140/90

## 2022-02-26 NOTE — Progress Notes (Signed)
   DICK HARK     MRN: [id number]      DOB: 04-12-1976   HPI Mr. Fishbaugh with past medical history of essential hypertension, PVD, NSTEMI is here for follow complaints of right shoulder pain.  Patient stated that 2 days ago he was climbing up a [place]cuk missed his step fell backward and landed on his left shoulder .  Patient denies hitting his head against any object .has aching pain 6/10, denies numbness, tingling, swelling. Has not taken any medication for his pain.   Patient complains of runny nose stuffy nose, chest congestion ,watery eyes sinus congestion since the past 8 days.  He has not taken any medication for his symptoms.  Patient denies fever, chills, wheezing, shortness of breath   Hypertension.  Currently on lisinopril 20 mg daily.  Patient denies edema, chest pain, dizziness.     ROS Denies recent fever or chills. Denies sinus pressure, nasal congestion, ear pain or sore throat. Denies chest congestion, productive cough or wheezing. Denies chest pains, palpitations and leg swelling Denies abdominal pain, nausea, vomiting,diarrhea or constipation.   Denies dysuria, frequency, hesitancy or incontinence. Denies headaches, seizures, numbness, or tingling. Denies depression, anxiety or insomnia. Denies skin break down or rash.   PE  BP (!) 168/110 (BP Location: Right Arm, Cuff Size: Large)   Pulse 82   Ht 6\' 2"  (1.88 m)   Wt (!) 401 lb (181.9 kg)   SpO2 94%   BMI 51.49 kg/m   Patient alert and oriented and in no cardiopulmonary distress.  HEENT: No facial asymmetry, EOMI,     Neck supple .  Chest: Clear to auscultation bilaterally.  CVS: S1, S2 no murmurs, no S3.Regular rate.  ABD: Soft non tender.   MS: Adequate ROM spine,  hips and knees.  Tenderness on range of motion of left shoulder no swelling or redness noted has palpable radial pulse skin warm and dry  Skin: Intact, no ulcerations or rash noted.  Psych: Good eye contact, normal affect. Memory  intact not anxious or depressed appearing.  CNS: CN 2-12 intact, power,  normal throughout.no focal deficits noted.   Assessment & Plan Essential hypertension BP Readings from Last 3 Encounters:  02/26/22 (!) 158/103  11/03/21 (!) 160/110  10/15/21 (!) 165/118  Chronic condition uncontrolled currently on lisinopril 20 mg daily Start lisinopril 40 mg daily DASH diet advised engage in regular moderate exercises at least 150 minutes weekly BMP in 2 weeks Follow-up in 4 weeks BP goal is less than 140/90  Allergic rhinitis Start Flonase nasal spray 2 spray into nostrils daily Take OTC Zyrtec 10 mg daily if symptoms does not improve  Acute pain of left shoulder Rx ibuprofen 600 mg 3 times daily as needed Rx Flexeril 5 mg  3 times daily as needed Patient told not to take Flexeril when operating machinery or driving due to risk of drowsiness Use of heat and stretching exercises encouraged

## 2022-02-26 NOTE — Patient Instructions (Addendum)
Please take lisinopril 40mg  dailyfor blood pressure . Blood pressure goal is less than 140/90. Monitor your blood pressure daily , keep a log and bring to next visit   Take ibuprofen 600mg  three times daily as needed, flexeril 5mg  three times daily as needed for your shoulder pain    Please 2 Flonase nasal spray , 2 spray into both nostrils daily , you can use over the counter zyrtec 10mg  daily if your symptoms does not get better on Flonase     It is important that you exercise regularly at least 30 minutes 5 times a week.  Think about what you will eat, plan ahead. Choose " clean, green, fresh or frozen" over canned, processed or packaged foods which are more sugary, salty and fatty. 70 to 75% of food eaten should be vegetables and fruit. Three meals at set times with snacks allowed between meals, but they must be fruit or vegetables. Aim to eat over a 12 hour period , example 7 am to 7 pm, and STOP after  your last meal of the day. Drink water,generally about 64 ounces per day, no other drink is as healthy. Fruit juice is best enjoyed in a healthy way, by EATING the fruit.  Thanks for choosing [facility], we consider it a privelige to serve you.

## 2022-02-26 NOTE — Assessment & Plan Note (Signed)
Start Flonase nasal spray 2 spray into nostrils daily Take OTC Zyrtec 10 mg daily if symptoms does not improve

## 2022-03-26 ENCOUNTER — Encounter: Payer: Self-pay | Admitting: Family Medicine

## 2022-03-26 ENCOUNTER — Ambulatory Visit (INDEPENDENT_AMBULATORY_CARE_PROVIDER_SITE_OTHER): Payer: 59 | Admitting: Family Medicine

## 2022-03-26 VITALS — BP 152/104 | HR 96 | Ht 73.0 in | Wt >= 6400 oz

## 2022-03-26 DIAGNOSIS — E559 Vitamin D deficiency, unspecified: Secondary | ICD-10-CM | POA: Diagnosis not present

## 2022-03-26 DIAGNOSIS — R7301 Impaired fasting glucose: Secondary | ICD-10-CM | POA: Diagnosis not present

## 2022-03-26 DIAGNOSIS — E782 Mixed hyperlipidemia: Secondary | ICD-10-CM

## 2022-03-26 DIAGNOSIS — I10 Essential (primary) hypertension: Secondary | ICD-10-CM

## 2022-03-26 DIAGNOSIS — E038 Other specified hypothyroidism: Secondary | ICD-10-CM

## 2022-03-26 MED ORDER — OLMESARTAN MEDOXOMIL-HCTZ 40-12.5 MG PO TABS: 1.0000 | ORAL_TABLET | Freq: Every day | ORAL | 1 refills | Status: DC

## 2022-03-26 NOTE — Assessment & Plan Note (Signed)
Will d/c lisinopril 40 mg daily We will start the patient on olmesartan hydrochlorothiazide 40-12.5 Encouraged to continue healthy lifestyle changes Encourage increasing his physical activities and a heart-healthy diet Will get labs today to assess patient's overall health We will follow up in 1 month to reassess blood pressure

## 2022-03-26 NOTE — Patient Instructions (Signed)
I appreciate the opportunity to provide care to you today!    Follow up:  3 months  Fasting Labs: please stop by the lab during the week to get your blood drawn (CBC, CMP, TSH, Lipid profile, HgA1c, Vit D)     Please continue to a heart-healthy diet and increase your physical activities. Try to exercise for 30mins at least three times a week.      It was a pleasure to see you and I look forward to continuing to work together on your health and well-being. Please do not hesitate to call the office if you need care or have questions about your care.   Have a wonderful day and week. With Gratitude, Jahred Tatar MSN, FNP-BC  

## 2022-03-26 NOTE — Progress Notes (Signed)
Established Patient Office Visit  Subjective:  Patient ID: Travis Beltran, male    DOB: 12/02/1975  Age: 46 y.o. MRN: [id number]  CC:  Chief Complaint  Patient presents with   Follow-up    4 week f/u for bp.     HPI Travis Beltran is a 46 y.o. male with past medical history of hypertension presents for f/u of  chronic medical conditions.  Hypertension: He takes lisinopril 40 mg daily.  He denies headaches, dizziness blurred vision, chest pain and palpitation.  He reports that he has started implementing lifestyle changes.  He walks twice daily and decreases his salt intake. He reports that he is tired of his clothes not fitting, his knees hurting, and being out of shape.  He reports that he has reduced smoking, noting that he used to smoke 2 packs daily but now smokes 1 pack of cigarettes weekly.     Past Medical History:  Diagnosis Date   Heart murmur    Hypertension     Past Surgical History:  Procedure Laterality Date   LEFT HEART CATH AND CORONARY ANGIOGRAPHY N/A 07/02/2020   Procedure: LEFT HEART CATH AND CORONARY ANGIOGRAPHY;  Surgeon: Martinique, Peter M, MD;  Location: [place] [facility];  Service: Cardiovascular;  Laterality: N/A;    Family History  Problem Relation Age of Onset   Cancer Father    Hypertension Father    Hypertension Mother     Social History   Socioeconomic History   Marital status: Married    Spouse name: Not on file   Number of children: Not on file   Years of education: Not on file   Highest education level: Not on file  Occupational History   Not on file  Tobacco Use   Smoking status: Every Day    Packs/day: 0.50    Types: Cigarettes   Smokeless tobacco: Never  Vaping Use   Vaping Use: Never used  Substance and Sexual Activity   Alcohol use: Yes    Comment: occ   Drug use: No   Sexual activity: Not on file  Other Topics Concern   Not on file  Social History Narrative   Not on file   Social Determinants of Health    Financial Resource Strain: Not on file  Food Insecurity: Not on file  Transportation Needs: Not on file  Physical Activity: Not on file  Stress: Not on file  Social Connections: Not on file  Intimate Partner Violence: Not on file    Outpatient Medications Prior to Visit  Medication Sig Dispense Refill   lisinopril (ZESTRIL) 40 MG tablet Take 1 tablet (40 mg total) by mouth daily. 90 tablet 0   benzonatate (TESSALON) 100 MG capsule Take 1 capsule (100 mg total) by mouth 2 (two) times daily as needed for cough. (Patient not taking: Reported on 02/26/2022) 20 capsule 0   doxycycline (VIBRAMYCIN) 100 MG capsule Take 1 capsule (100 mg total) by mouth 2 (two) times daily. (Patient not taking: Reported on 11/03/2021) 20 capsule 0   fluticasone (FLONASE) 50 MCG/ACT nasal spray Place 2 sprays into both nostrils daily. (Patient not taking: Reported on 03/26/2022) 16 g 6   furosemide (LASIX) 40 MG tablet Take 80 mg by mouth every morning. (Patient not taking: Reported on 02/26/2022)     gabapentin (NEURONTIN) 100 MG capsule Take 1 capsule (100 mg total) by mouth 3 (three) times daily. (Patient not taking: Reported on 11/03/2021) 90 capsule 3   hydrOXYzine (ATARAX/VISTARIL)  25 MG tablet Take 1 tablet (25 mg total) by mouth every 6 (six) hours as needed for itching. (Patient not taking: Reported on 11/03/2021) 30 tablet 0   ibuprofen (ADVIL) 600 MG tablet Take 1 tablet (600 mg total) by mouth every 8 (eight) hours as needed. (Patient not taking: Reported on 03/26/2022) 30 tablet 0   potassium chloride (KLOR-CON) 20 MEQ packet Take 20 mEq by mouth daily. (Patient not taking: Reported on 03/26/2022)     amLODipine (NORVASC) 5 MG tablet TAKE 1 TABLET (5 MG TOTAL) BY MOUTH DAILY. (Patient not taking: Reported on 10/13/2021) 30 tablet 0   cephALEXin (KEFLEX) 500 MG capsule Take 1 capsule (500 mg total) by mouth 4 (four) times daily. (Patient not taking: Reported on 11/03/2021) 28 capsule 0   cyclobenzaprine (FLEXERIL) 5  MG tablet Take 1 tablet (5 mg total) by mouth 3 (three) times daily as needed for muscle spasms. 30 tablet 1   doxycycline (VIBRAMYCIN) 100 MG capsule Take 1 capsule (100 mg total) by mouth 2 (two) times daily. (Patient not taking: Reported on 11/03/2021) 14 capsule 0   methylPREDNISolone (MEDROL DOSEPAK) 4 MG TBPK tablet Take as the package instructed (Patient not taking: Reported on 02/26/2022) 1 each 0   pantoprazole (PROTONIX) 40 MG tablet TAKE 1 TABLET (40 MG TOTAL) BY MOUTH DAILY. 30 tablet 0   No facility-administered medications prior to visit.    Allergies  Allergen Reactions   Amlodipine Besylate Rash    ROS Review of Systems  Constitutional:  Negative for chills and fatigue.  Respiratory:  Negative for chest tightness and shortness of breath.   Cardiovascular:  Negative for chest pain and palpitations.  Neurological:  Negative for dizziness and headaches.      Objective:    Physical Exam Constitutional:      Appearance: He is obese.  HENT:     Head: Normocephalic.  Cardiovascular:     Rate and Rhythm: Normal rate and regular rhythm.     Pulses: Normal pulses.     Heart sounds: Normal heart sounds.  Pulmonary:     Effort: Pulmonary effort is normal.     Breath sounds: Normal breath sounds.  Neurological:     Mental Status: He is alert.     BP (!) 152/104 (BP Location: Left Arm)   Pulse 96   Ht 6' 1"  (1.854 m)   Wt (!) 402 lb 0.6 oz (182.4 kg)   SpO2 95%   BMI 53.04 kg/m  Wt Readings from Last 3 Encounters:  03/26/22 (!) 402 lb 0.6 oz (182.4 kg)  02/26/22 (!) 401 lb (181.9 kg)  11/03/21 (!) 407 lb (184.6 kg)    Lab Results  Component Value Date   TSH 1.660 10/13/2021   Lab Results  Component Value Date   WBC CANCELED 10/13/2021   HGB CANCELED 10/13/2021   HCT CANCELED 10/13/2021   MCV 89.8 07/29/2021   PLT CANCELED 10/13/2021   Lab Results  Component Value Date   NA 143 10/13/2021   K 5.0 10/13/2021   CO2 16 (L) 10/13/2021   GLUCOSE 85  10/13/2021   BUN 6 10/13/2021   CREATININE 0.95 10/13/2021   BILITOT 0.4 10/13/2021   ALKPHOS 106 10/13/2021   AST 16 10/13/2021   ALT 11 10/13/2021   PROT 6.9 10/13/2021   ALBUMIN 4.3 10/13/2021   CALCIUM 9.5 10/13/2021   ANIONGAP 6 07/29/2021   EGFR 101 10/13/2021   Lab Results  Component Value Date   CHOL 148  10/13/2021   Lab Results  Component Value Date   HDL 41 10/13/2021   Lab Results  Component Value Date   LDLCALC 90 10/13/2021   Lab Results  Component Value Date   TRIG 87 10/13/2021   Lab Results  Component Value Date   CHOLHDL 3.6 10/13/2021   Lab Results  Component Value Date   HGBA1C CANCELED 10/13/2021      Assessment & Plan:   Problem List Items Addressed This Visit       Cardiovascular and Mediastinum   Essential hypertension - Primary    Will d/c lisinopril 40 mg daily We will start the patient on olmesartan hydrochlorothiazide 40-12.5 Encouraged to continue healthy lifestyle changes Encourage increasing his physical activities and a heart-healthy diet Will get labs today to assess patient's overall health We will follow up in 1 month to reassess blood pressure      Relevant Medications   olmesartan-hydrochlorothiazide (BENICAR HCT) 40-12.5 MG tablet   Other Relevant Orders   CBC with Differential/Platelet   CMP14+EGFR   Other Visit Diagnoses     Vitamin D deficiency       Relevant Orders   Vitamin D (25 hydroxy)   IFG (impaired fasting glucose)       Relevant Orders   Hemoglobin A1C   Mixed hyperlipidemia       Relevant Medications   olmesartan-hydrochlorothiazide (BENICAR HCT) 40-12.5 MG tablet   Other Relevant Orders   Lipid Profile   Other specified hypothyroidism       Relevant Orders   TSH + free T4       Meds ordered this encounter  Medications   olmesartan-hydrochlorothiazide (BENICAR HCT) 40-12.5 MG tablet    Sig: Take 1 tablet by mouth daily.    Dispense:  30 tablet    Refill:  1    Follow-up: Return in  about 1 month (around 04/26/2022) for BP.    [name], FNP

## 2022-04-09 ENCOUNTER — Encounter: Payer: Self-pay | Admitting: Family Medicine

## 2022-04-10 ENCOUNTER — Ambulatory Visit (INDEPENDENT_AMBULATORY_CARE_PROVIDER_SITE_OTHER): Payer: 59 | Admitting: Family Medicine

## 2022-04-10 DIAGNOSIS — R42 Dizziness and giddiness: Secondary | ICD-10-CM | POA: Diagnosis not present

## 2022-04-10 NOTE — Progress Notes (Signed)
Virtual Visit via Telephone Note   This visit type was conducted via telephone. This format is felt to be most appropriate for this patient at this time.  The patient did not have access to video technology/had technical difficulties with video requiring transitioning to audio format only (telephone).  All issues noted in this document were discussed and addressed.  No physical exam could be performed with this format.  Evaluation Performed:  Follow-up visit  Date:  04/10/2022   ID:  Travis Beltran, DOB 1975-07-30, MRN [id number]  Patient Location: Home Provider Location: Office/Clinic  Participants: Patient Location of Patient: Home Location of Provider: clinic Consent was obtain for visit to be over via telehealth. I verified that I am speaking with the correct person using two identifiers.  PCP:  [name], FNP   Chief Complaint:  dizzy spells  History of Present Illness:    Travis Beltran is a 46 y.o. male with complaints of dizzy spells and shortness of breath since starting olmesartan-hydrochlorothiazide 40-12.5 mg. He reports starting medication on 10 10/2021 and admits to minimal fluid intake.  He reports not checking  his ambulatory Bps and admits to minimal physical activities.  The patient does not have symptoms concerning for COVID-19 infection (fever, chills, cough, or new shortness of breath).   Past Medical, Surgical, Social History, Allergies, and Medications have been Reviewed.  Past Medical History:  Diagnosis Date   Heart murmur    Hypertension    Past Surgical History:  Procedure Laterality Date   LEFT HEART CATH AND CORONARY ANGIOGRAPHY N/A 07/02/2020   Procedure: LEFT HEART CATH AND CORONARY ANGIOGRAPHY;  Surgeon: Martinique, Peter M, MD;  Location: [place] [facility];  Service: Cardiovascular;  Laterality: N/A;     Current Meds  Medication Sig   hydrOXYzine (ATARAX/VISTARIL) 25 MG tablet Take 1 tablet (25 mg total) by mouth every 6  (six) hours as needed for itching.   olmesartan-hydrochlorothiazide (BENICAR HCT) 40-12.5 MG tablet Take 1 tablet by mouth daily.   [DISCONTINUED] benzonatate (TESSALON) 100 MG capsule Take 1 capsule (100 mg total) by mouth 2 (two) times daily as needed for cough.   [DISCONTINUED] doxycycline (VIBRAMYCIN) 100 MG capsule Take 1 capsule (100 mg total) by mouth 2 (two) times daily.   [DISCONTINUED] fluticasone (FLONASE) 50 MCG/ACT nasal spray Place 2 sprays into both nostrils daily.   [DISCONTINUED] furosemide (LASIX) 40 MG tablet Take 80 mg by mouth every morning.   [DISCONTINUED] gabapentin (NEURONTIN) 100 MG capsule Take 1 capsule (100 mg total) by mouth 3 (three) times daily.   [DISCONTINUED] ibuprofen (ADVIL) 600 MG tablet Take 1 tablet (600 mg total) by mouth every 8 (eight) hours as needed.   [DISCONTINUED] potassium chloride (KLOR-CON) 20 MEQ packet Take 20 mEq by mouth daily.     Allergies:   Amlodipine besylate   ROS:   Please see the history of present illness.     All other systems reviewed and are negative.   Labs/Other Tests and Data Reviewed:    Recent Labs: 04/09/2022: ALT 15; BUN 9; Creatinine, Ser 0.94; Hemoglobin 16.2; Platelets 308; Potassium 4.3; Sodium 143; TSH WILL FOLLOW   Recent Lipid Panel Lab Results  Component Value Date/Time   CHOL WILL FOLLOW 04/09/2022 02:09 PM   TRIG 67 04/09/2022 02:09 PM   HDL 49 04/09/2022 02:09 PM   CHOLHDL WILL FOLLOW 04/09/2022 02:09 PM   CHOLHDL 4.3 06/29/2020 07:07 AM   LDLCALC WILL FOLLOW 04/09/2022 02:09 PM  Wt Readings from Last 3 Encounters:  03/26/22 (!) 402 lb 0.6 oz (182.4 kg)  02/26/22 (!) 401 lb (181.9 kg)  11/03/21 (!) 407 lb (184.6 kg)     Objective:    Vital Signs:  There were no vitals taken for this visit.     ASSESSMENT & PLAN:   Dizzy spells Encouraged the patient to increase his fluid intake, especially while taking diuretic Encouraged the patient to consume at least 64 ounces of fluids  daily Encourage increased physical activities We will follow up on BP in the clinic on 04/27/2022  Time:   Today, I have spent  minutes reviewing the chart, including problem list, medications, and with the patient with telehealth technology discussing the above problems.   Medication Adjustments/Labs and Tests Ordered: Current medicines are reviewed at length with the patient today.  Concerns regarding medicines are outlined above.   Tests Ordered: No orders of the defined types were placed in this encounter.   Medication Changes: No orders of the defined types were placed in this encounter.    Note: This dictation was prepared with Dragon dictation along with smaller phrase technology. Similar sounding words can be transcribed inadequately or may not be corrected upon review. Any transcriptional errors that result from this process are unintentional.      Disposition:  Follow up  Signed, [name], FNP  04/10/2022 11:21 PM     [facility]

## 2022-04-11 LAB — CBC WITH DIFFERENTIAL/PLATELET
Basophils Absolute: 0.1 10*3/uL (ref 0.0–0.2)
Basos: 1 %
EOS (ABSOLUTE): 0.7 10*3/uL — ABNORMAL HIGH (ref 0.0–0.4)
Eos: 7 %
Hematocrit: 48.3 % (ref 37.5–51.0)
Hemoglobin: 16.2 g/dL (ref 13.0–17.7)
Immature Grans (Abs): 0 10*3/uL (ref 0.0–0.1)
Immature Granulocytes: 0 %
Lymphocytes Absolute: 1.9 10*3/uL (ref 0.7–3.1)
Lymphs: 21 %
MCH: 28.7 pg (ref 26.6–33.0)
MCHC: 33.5 g/dL (ref 31.5–35.7)
MCV: 86 fL (ref 79–97)
Monocytes Absolute: 0.6 10*3/uL (ref 0.1–0.9)
Monocytes: 6 %
Neutrophils Absolute: 6 10*3/uL (ref 1.4–7.0)
Neutrophils: 65 %
Platelets: 308 10*3/uL (ref 150–450)
RBC: 5.65 x10E6/uL (ref 4.14–5.80)
RDW: 13.8 % (ref 11.6–15.4)
WBC: 9.2 10*3/uL (ref 3.4–10.8)

## 2022-04-11 LAB — LIPID PANEL
Chol/HDL Ratio: 2.8 ratio (ref 0.0–5.0)
Cholesterol, Total: 137 mg/dL (ref 100–199)
HDL: 49 mg/dL (ref >39)
LDL Chol Calc (NIH): 74 mg/dL (ref 0–99)
Triglycerides: 67 mg/dL (ref 0–149)
VLDL Cholesterol Cal: 14 mg/dL (ref 5–40)

## 2022-04-11 LAB — CMP14+EGFR
ALT: 15 IU/L (ref 0–44)
AST: 29 IU/L (ref 0–40)
Albumin/Globulin Ratio: 1.5 (ref 1.2–2.2)
Albumin: 4.2 g/dL (ref 4.1–5.1)
Alkaline Phosphatase: 107 IU/L (ref 44–121)
BUN/Creatinine Ratio: 10 (ref 9–20)
BUN: 9 mg/dL (ref 6–24)
Bilirubin Total: 0.5 mg/dL (ref 0.0–1.2)
CO2: 22 mmol/L (ref 20–29)
Calcium: 9.2 mg/dL (ref 8.7–10.2)
Chloride: 103 mmol/L (ref 96–106)
Creatinine, Ser: 0.94 mg/dL (ref 0.76–1.27)
Globulin, Total: 2.8 g/dL (ref 1.5–4.5)
Glucose: 93 mg/dL (ref 70–99)
Potassium: 4.3 mmol/L (ref 3.5–5.2)
Sodium: 143 mmol/L (ref 134–144)
Total Protein: 7 g/dL (ref 6.0–8.5)
eGFR: 102 mL/min/{1.73_m2} (ref >59)

## 2022-04-11 LAB — HEMOGLOBIN A1C
Est. average glucose Bld gHb Est-mCnc: 123 mg/dL
Hgb A1c MFr Bld: 5.9 % — ABNORMAL HIGH (ref 4.8–5.6)

## 2022-04-11 LAB — TSH+FREE T4
Free T4: 1.34 ng/dL (ref 0.82–1.77)
TSH: 0.896 u[IU]/mL (ref 0.450–4.500)

## 2022-04-11 LAB — VITAMIN D 25 HYDROXY (VIT D DEFICIENCY, FRACTURES): Vit D, 25-Hydroxy: 24.2 ng/mL — ABNORMAL LOW (ref 30.0–100.0)

## 2022-04-13 ENCOUNTER — Other Ambulatory Visit: Payer: Self-pay | Admitting: Family Medicine

## 2022-04-13 DIAGNOSIS — E559 Vitamin D deficiency, unspecified: Secondary | ICD-10-CM

## 2022-04-13 MED ORDER — VITAMIN D (ERGOCALCIFEROL) 1.25 MG (50000 UNIT) PO CAPS: 50000.0000 [IU] | ORAL_CAPSULE | ORAL | 3 refills | Status: DC

## 2022-04-13 NOTE — Progress Notes (Signed)
Please inform the patient that he has prediabetes.  I recommend low sugar intake.  His vitamin D is slightly low, I have sent a prescription for once weekly vitamin D supplement to his pharmacy to start taking.  All other labs are stable.

## 2022-04-14 NOTE — Progress Notes (Signed)
That is fine 

## 2022-04-17 ENCOUNTER — Ambulatory Visit (INDEPENDENT_AMBULATORY_CARE_PROVIDER_SITE_OTHER): Payer: 59 | Admitting: Family Medicine

## 2022-04-17 ENCOUNTER — Encounter: Payer: Self-pay | Admitting: Family Medicine

## 2022-04-17 VITALS — BP 128/88 | HR 94 | Ht 74.0 in | Wt >= 6400 oz

## 2022-04-17 DIAGNOSIS — I10 Essential (primary) hypertension: Secondary | ICD-10-CM

## 2022-04-17 NOTE — Assessment & Plan Note (Addendum)
Controlled Low suspicion of heart failure Informed the patient of symptomatic hypotension during the first few days of therapy Encouraged the patient to increase his oral intake of fluids, drinking more than 64 ounces of water daily given his height and weight Encouraged to change positions slowly Encouraged to continue increasing his physical activities and heart healthy diet No changes made to treatment regimen BP Readings from Last 3 Encounters:  04/17/22 128/88  03/26/22 (!) 152/104  02/26/22 (!) 158/103

## 2022-04-17 NOTE — Progress Notes (Addendum)
   Acute Office Visit  Subjective:     Patient ID: Travis Beltran, male    DOB: March 20, 1976, 46 y.o.   MRN: [id number]  Chief Complaint  Patient presents with   Shortness of Breath    Pt reports cough, and sob, this started when bp medication was switched.     Shortness of Breath Pertinent negatives include no claudication, fever, headaches, leg swelling, orthopnea, sputum production or wheezing.   Patient is in today with complaints of cough and shortness of breath with occasional dizziness since starting antihypertensive treatment.  He denies orthopnea, swelling in the legs, ankles, and feet, wheezing, and fatigue.  He reports increased physical activities and has lost 2 pounds since last seen.  He reports eating healthier.  He denies dizziness, chest pain, shortness of breath, and palpitations today.    Review of Systems  Constitutional:  Negative for chills and fever.  Respiratory:  Negative for sputum production, shortness of breath and wheezing.   Cardiovascular:  Negative for palpitations, orthopnea, claudication and leg swelling.  Neurological:  Negative for dizziness and headaches.        Objective:    BP 128/88   Pulse 94   Ht 6\' 2"  (1.88 m)   Wt (!) 400 lb 1.9 oz (181.5 kg)   SpO2 93%   BMI 51.37 kg/m    Physical Exam HENT:     Head: Normocephalic.  Eyes:     Extraocular Movements: Extraocular movements intact.     Pupils: Pupils are equal, round, and reactive to light.  Cardiovascular:     Rate and Rhythm: Normal rate and regular rhythm.  Pulmonary:     Breath sounds: Normal breath sounds. No wheezing.  Chest:     Chest wall: No tenderness.  Neurological:     Mental Status: He is alert.     No results found for any visits on 04/17/22.      Assessment & Plan:   Problem List Items Addressed This Visit       Cardiovascular and Mediastinum   Essential hypertension - Primary    Controlled Low suspicion of heart failure Informed the  patient of symptomatic hypotension during the first few days of therapy Encouraged the patient to increase his oral intake of fluids, drinking more than 64 ounces of water daily given his height and weight Encouraged to change positions slowly Encouraged to continue increasing his physical activities and heart healthy diet No changes made to treatment regimen BP Readings from Last 3 Encounters:  04/17/22 128/88  03/26/22 (!) 152/104  02/26/22 (!) 158/103         No orders of the defined types were placed in this encounter.   Return in about 10 days (around 04/27/2022).  [name], FNP

## 2022-04-17 NOTE — Patient Instructions (Addendum)
I appreciate the opportunity to provide care to you today!    Follow up:  04/27/2022     Please continue to a heart-healthy diet and increase your physical activities. Try to exercise for 64mins at least three times a week.      It was a pleasure to see you and I look forward to continuing to work together on your health and well-being. Please do not hesitate to call the office if you need care or have questions about your care.   Have a wonderful day and week. With Gratitude, [name] MSN, FNP-BC

## 2022-04-27 ENCOUNTER — Encounter: Payer: Self-pay | Admitting: Family Medicine

## 2022-04-27 ENCOUNTER — Ambulatory Visit: Payer: PRIVATE HEALTH INSURANCE | Admitting: Family Medicine

## 2022-05-04 ENCOUNTER — Ambulatory Visit (INDEPENDENT_AMBULATORY_CARE_PROVIDER_SITE_OTHER): Payer: 59 | Admitting: Internal Medicine

## 2022-05-04 ENCOUNTER — Encounter: Payer: Self-pay | Admitting: Internal Medicine

## 2022-05-04 VITALS — BP 149/91 | HR 82 | Ht 74.0 in | Wt 398.2 lb

## 2022-05-04 DIAGNOSIS — Z1211 Encounter for screening for malignant neoplasm of colon: Secondary | ICD-10-CM

## 2022-05-04 DIAGNOSIS — I10 Essential (primary) hypertension: Secondary | ICD-10-CM | POA: Diagnosis not present

## 2022-05-04 MED ORDER — OLMESARTAN MEDOXOMIL-HCTZ 40-12.5 MG PO TABS: 1.0000 | ORAL_TABLET | Freq: Every day | ORAL | 1 refills | Status: DC

## 2022-05-04 NOTE — Assessment & Plan Note (Addendum)
Referral placed for screening colonoscopy today.

## 2022-05-04 NOTE — Assessment & Plan Note (Signed)
BP 149/91 today, however he has not taken his medication.  He is currently prescribed olmesartan-HCTZ 40-12.5 mg daily and has been out of medication x2 days.  His blood pressure, while on medication, last month was 128/88. -No medication changes today.  I have refilled olmesartan-HCTZ. -Follow-up for routine care in 2 months

## 2022-05-04 NOTE — Progress Notes (Signed)
Established Patient Office Visit  Subjective   Patient ID: Travis Beltran, male    DOB: 16-May-1976  Age: 46 y.o. MRN: [id number]  Chief Complaint  Patient presents with   Follow-up    BP   Travis Beltran returns to care today for BP check.  He is a 46 year old male who was seen at [facility] on 10/5 by [name], NP for HTN follow-up.  He was switched to olmesartan-HCTZ 40-12.5 mg daily at that time.  In the interim, he has been seen for acute visits for dizziness and shortness of breath.  His blood pressure on 10/27 was 128/88.  Today Travis Beltran states that he feels well.  He states that he is recovering from a "chest cold" and has no acute concerns to discuss.  He reports that he has been out of his medication for 2 days and did not take it prior to today's appointment.  His blood pressure today is 149/91.  Past Medical History:  Diagnosis Date   Heart murmur    Hypertension    Past Surgical History:  Procedure Laterality Date   LEFT HEART CATH AND CORONARY ANGIOGRAPHY N/A 07/02/2020   Procedure: LEFT HEART CATH AND CORONARY ANGIOGRAPHY;  Surgeon: Martinique, Peter M, MD;  Location: [place] [facility];  Service: Cardiovascular;  Laterality: N/A;   Social History   Tobacco Use   Smoking status: Every Day    Packs/day: 0.50    Types: Cigarettes   Smokeless tobacco: Never  Vaping Use   Vaping Use: Never used  Substance Use Topics   Alcohol use: Yes    Comment: occ   Drug use: No   Family History  Problem Relation Age of Onset   Cancer Father    Hypertension Father    Hypertension Mother    Allergies  Allergen Reactions   Amlodipine Besylate Rash      Review of Systems  HENT:  Positive for congestion.   All other systems reviewed and are negative.    Objective:     BP (!) 149/91   Pulse 82   Ht _0  (1.88 m)   Wt (!) 398 lb 3.2 oz (180.6 kg)   SpO2 90%   BMI 51.13 kg/m  BP Readings from Last 3 Encounters:  05/04/22 (!) 149/91  04/17/22 128/88  03/26/22  (!) 152/104      Physical Exam Vitals reviewed.  Constitutional:      General: He is not in acute distress.    Appearance: Normal appearance. He is obese. He is not ill-appearing.  HENT:     Head: Normocephalic and atraumatic.     Right Ear: External ear normal.     Left Ear: External ear normal.     Nose: Nose normal. No congestion or rhinorrhea.     Mouth/Throat:     Mouth: Mucous membranes are moist.     Pharynx: Oropharynx is clear.  Eyes:     Extraocular Movements: Extraocular movements intact.     Conjunctiva/sclera: Conjunctivae normal.     Pupils: Pupils are equal, round, and reactive to light.  Cardiovascular:     Rate and Rhythm: Normal rate and regular rhythm.     Pulses: Normal pulses.     Heart sounds: Normal heart sounds. No murmur heard. Pulmonary:     Effort: Pulmonary effort is normal.     Breath sounds: Normal breath sounds. No wheezing, rhonchi or rales.  Abdominal:     General: Abdomen is flat. Bowel sounds  are normal. There is no distension.     Palpations: Abdomen is soft.     Tenderness: There is no abdominal tenderness.  Musculoskeletal:        General: No swelling or deformity. Normal range of motion.     Cervical back: Normal range of motion.  Skin:    General: Skin is warm and dry.     Capillary Refill: Capillary refill takes less than 2 seconds.  Neurological:     General: No focal deficit present.     Mental Status: He is alert and oriented to person, place, and time.     Motor: No weakness.  Psychiatric:        Mood and Affect: Mood normal.        Behavior: Behavior normal.        Thought Content: Thought content normal.    Last CBC Lab Results  Component Value Date   WBC 9.2 04/09/2022   HGB 16.2 04/09/2022   HCT 48.3 04/09/2022   MCV 86 04/09/2022   MCH 28.7 04/09/2022   RDW 13.8 04/09/2022   PLT 308 18/84/1660   Last metabolic panel Lab Results  Component Value Date   GLUCOSE 93 04/09/2022   NA 143 04/09/2022   K 4.3  04/09/2022   CL 103 04/09/2022   CO2 22 04/09/2022   BUN 9 04/09/2022   CREATININE 0.94 04/09/2022   EGFR 102 04/09/2022   CALCIUM 9.2 04/09/2022   PROT 7.0 04/09/2022   ALBUMIN 4.2 04/09/2022   LABGLOB 2.8 04/09/2022   AGRATIO 1.5 04/09/2022   BILITOT 0.5 04/09/2022   ALKPHOS 107 04/09/2022   AST 29 04/09/2022   ALT 15 04/09/2022   ANIONGAP 6 07/29/2021   Last lipids Lab Results  Component Value Date   CHOL 137 04/09/2022   HDL 49 04/09/2022   LDLCALC 74 04/09/2022   TRIG 67 04/09/2022   CHOLHDL 2.8 04/09/2022   Last hemoglobin A1c Lab Results  Component Value Date   HGBA1C 5.9 (H) 04/09/2022   Last thyroid functions Lab Results  Component Value Date   TSH 0.896 04/09/2022   Last vitamin D Lab Results  Component Value Date   VD25OH 24.2 (L) 04/09/2022     Assessment & Plan:   Problem List Items Addressed This Visit       Essential hypertension    BP 149/91 today, however he has not taken his medication.  He is currently prescribed olmesartan-HCTZ 40-12.5 mg daily and has been out of medication x2 days.  His blood pressure, while on medication, last month was 128/88. -No medication changes today.  I have refilled olmesartan-HCTZ. -Follow-up for routine care in 2 months      Screening for colon cancer - Primary    Referral placed for screening colonoscopy today.      Relevant Orders   Ambulatory referral to Gastroenterology    Return in about 2 months (around 07/04/2022).    Johnette Abraham, MD

## 2022-05-04 NOTE — Patient Instructions (Signed)
It was a pleasure to see you today.  Thank you for giving [place] the opportunity to be involved in your care.  Below is a brief recap of your visit and next steps.  We will plan to see you again in 2 months.  Summary I have refilled olmesartan-hctz today Follow up in 2 months

## 2022-05-05 ENCOUNTER — Encounter: Payer: Self-pay | Admitting: *Deleted

## 2022-05-10 ENCOUNTER — Emergency Department (HOSPITAL_COMMUNITY)
Admission: EM | Admit: 2022-05-10 | Discharge: 2022-05-10 | Disposition: A | Discharge status: Home / Self Care | Payer: Self-pay | Attending: Emergency Medicine | Admitting: Emergency Medicine

## 2022-05-10 ENCOUNTER — Other Ambulatory Visit: Payer: Self-pay

## 2022-05-10 DIAGNOSIS — Z79899 Other long term (current) drug therapy: Secondary | ICD-10-CM | POA: Insufficient documentation

## 2022-05-10 DIAGNOSIS — I10 Essential (primary) hypertension: Secondary | ICD-10-CM | POA: Insufficient documentation

## 2022-05-10 DIAGNOSIS — N492 Inflammatory disorders of scrotum: Secondary | ICD-10-CM | POA: Insufficient documentation

## 2022-05-10 MED ORDER — DOXYCYCLINE HYCLATE 100 MG PO CAPS: 100.0000 mg | ORAL_CAPSULE | Freq: Two times a day (BID) | ORAL | 0 refills | Status: DC

## 2022-05-10 NOTE — ED Provider Notes (Signed)
[facility] EMERGENCY DEPARTMENT Provider Note   CSN: [id number] Arrival date & time: 05/10/22  5732     History  Chief Complaint  Patient presents with   Abscess    Travis Beltran is a 46 y.o. male.  Left testicle area that has been present since Monday.  And it started draining spontaneously on Friday.  Patient's had problems in those areas before.  Patient is not a diabetic.  Past medical history significant for hypertension.  Patient denies any fevers.  No difficulty urinating.       Home Medications Prior to Admission medications   Medication Sig Start Date End Date Taking? Authorizing Provider  doxycycline (VIBRAMYCIN) 100 MG capsule Take 1 capsule (100 mg total) by mouth 2 (two) times daily. 05/10/22  Yes Vanetta Mulders, MD  olmesartan-hydrochlorothiazide (BENICAR HCT) 40-12.5 MG tablet Take 1 tablet by mouth daily. 05/04/22 10/31/22  Billie Lade, MD  predniSONE (STERAPRED UNI-PAK 21 TAB) 10 MG (21) TBPK tablet 10mg  Tabs, 6 day taper. Use as directed Patient not taking: Reported on 11/03/2021 12/14/20   12/16/20, MD      Allergies    Amlodipine besylate    Review of Systems   Review of Systems  Constitutional:  Negative for chills and fever.  HENT:  Negative for rhinorrhea and sore throat.   Eyes:  Negative for visual disturbance.  Respiratory:  Negative for cough and shortness of breath.   Cardiovascular:  Negative for chest pain and leg swelling.  Gastrointestinal:  Negative for abdominal pain, diarrhea, nausea and vomiting.  Genitourinary:  Negative for dysuria.  Musculoskeletal:  Negative for back pain and neck pain.  Skin:  Positive for wound. Negative for rash.  Neurological:  Negative for dizziness, light-headedness and headaches.  Hematological:  Does not bruise/bleed easily.  Psychiatric/Behavioral:  Negative for confusion.     Physical Exam Updated Vital Signs BP (!) 146/98 (BP Location: Left Arm)   Pulse 85   Temp 98 F (36.7  C) (Oral)   Resp 20   Ht 1.88 m (6\' 2" )   Wt (!) 180.5 kg   SpO2 95%   BMI 51.10 kg/m  Physical Exam Vitals and nursing note reviewed.  Constitutional:      General: He is not in acute distress.    Appearance: He is well-developed.  HENT:     Head: Normocephalic and atraumatic.  Eyes:     Conjunctiva/sclera: Conjunctivae normal.  Cardiovascular:     Rate and Rhythm: Normal rate and regular rhythm.     Heart sounds: No murmur heard. Pulmonary:     Effort: Pulmonary effort is normal. No respiratory distress.     Breath sounds: Normal breath sounds.  Abdominal:     Palpations: Abdomen is soft.     Tenderness: There is no abdominal tenderness.  Genitourinary:    Penis: Normal.      Testes: Normal.     Comments: Patient circumcised.  Area of induration to the left scrotal area measuring about 4 x 5 cm.  No fluctuance.  About 3 close together superficial boils.  1 draining spontaneously.  Purulent and sanguinous material.  Draining quite readily.  Right scrotal area normal testes bilaterally normal Musculoskeletal:        General: No swelling.     Cervical back: Neck supple.  Skin:    General: Skin is warm and dry.     Capillary Refill: Capillary refill takes less than 2 seconds.  Neurological:  General: No focal deficit present.     Mental Status: He is alert and oriented to person, place, and time.  Psychiatric:        Mood and Affect: Mood normal.     ED Results / Procedures / Treatments   Labs (all labs ordered are listed, but only abnormal results are displayed) Labs Reviewed - No data to display  EKG None  Radiology No results found.  Procedures Procedures    Medications Ordered in ED Medications - No data to display  ED Course/ Medical Decision Making/ A&P                           Medical Decision Making Risk Prescription drug management.   Patient with scrotal skin abscess.  No evidence of any deep abscess or any concern for Fournier's  gangrene.  The area of abscess is draining spontaneously so there is no significant pus pocket at this time.  Do not see a need for incision and drainage at this time.  We will have patient soak in warm water twice a day.  And have patient take doxycycline antibiotic.  He will return for any new or worse symptoms.  Have patient follow-up with primary care provider.   Final Clinical Impression(s) / ED Diagnoses Final diagnoses:  Abscess of scrotal wall    Rx / DC Orders ED Discharge Orders          Ordered    doxycycline (VIBRAMYCIN) 100 MG capsule  2 times daily        05/10/22 [id number]              Fredia Sorrow, MD 05/10/22 [phone]

## 2022-05-10 NOTE — Discharge Instructions (Signed)
Take the antibiotic doxycycline as directed for the next 7 days.  Soak your scrotal area in warm tub water for about 20 minutes a day if he can do that twice a day that would be ideal.  Currently the abscess is draining so no need for incision at this time.  If you get worsening scrotal swelling the need to get seen.  Make an appointment to follow-up with your primary care doctor.  Okay to take Motrin as needed for pain.

## 2022-05-10 NOTE — ED Triage Notes (Signed)
Pt c/o boil on left testicle since Monday. Area is draining red fluid. Pt is afebrile.

## 2022-07-06 ENCOUNTER — Ambulatory Visit: Payer: 59 | Admitting: Family Medicine

## 2022-07-06 ENCOUNTER — Telehealth: Payer: Self-pay | Admitting: Family Medicine

## 2022-07-06 NOTE — Telephone Encounter (Signed)
Pt has missed 3 appts as of today (11/08/21, 04/27/22 and 06/26/22). A letter was sent on 04/27/22. How would you like me to proceed?

## 2022-07-09 ENCOUNTER — Ambulatory Visit (INDEPENDENT_AMBULATORY_CARE_PROVIDER_SITE_OTHER): Payer: PRIVATE HEALTH INSURANCE | Admitting: Family Medicine

## 2022-07-09 ENCOUNTER — Encounter: Payer: Self-pay | Admitting: Family Medicine

## 2022-07-09 VITALS — BP 128/80 | HR 79 | Ht 74.0 in | Wt 399.1 lb

## 2022-07-09 DIAGNOSIS — E038 Other specified hypothyroidism: Secondary | ICD-10-CM

## 2022-07-09 DIAGNOSIS — E559 Vitamin D deficiency, unspecified: Secondary | ICD-10-CM

## 2022-07-09 DIAGNOSIS — E7849 Other hyperlipidemia: Secondary | ICD-10-CM | POA: Diagnosis not present

## 2022-07-09 DIAGNOSIS — F17200 Nicotine dependence, unspecified, uncomplicated: Secondary | ICD-10-CM

## 2022-07-09 DIAGNOSIS — I10 Essential (primary) hypertension: Secondary | ICD-10-CM

## 2022-07-09 DIAGNOSIS — R7301 Impaired fasting glucose: Secondary | ICD-10-CM | POA: Diagnosis not present

## 2022-07-09 NOTE — Assessment & Plan Note (Signed)
Controlled He takes olmesartan-hydrochlorothiazide 40-12.5 milligrams daily He reports adherence with treatment regimen He denies headaches, dizziness, blurred vision, chest pain, palpitation, and shortness of breath Dash diet reviewed Low-sodium diet with increased physical activities Will assess CBC and CMP today Encouraged to continue on therapy BP Readings from Last 3 Encounters:  07/09/22 128/80  05/10/22 (!) 146/98  05/04/22 (!) 149/91

## 2022-07-09 NOTE — Assessment & Plan Note (Signed)
Patient smokes half a pack cigarettes daily, of note, he used to smoke 2 packs daily Smoking cessation encourage Education has follow: Smoking is harmful to your health and increases your risk for cancer, COPD, high blood pressure, cataracts, digestive problems, or health problems , such as gum disease, mouth sores, and tooth loss and loss of taste and smell. Smoking irritates your throat and causes coughing.

## 2022-07-09 NOTE — Patient Instructions (Addendum)
I appreciate the opportunity to provide care to you today!    Follow up:  3 months  Labs: please stop by the lab today to get your blood drawn (CBC, CMP, TSH, Lipid profile, HgA1c, Vit D)  Continue taking medication as prescribed I recommend low-sodium diet with increased physical activities I recommend decreasing your sodium intake to less than 1500 mg daily  Physical activity helps: Lower your blood glucose, improve your heart health, lower your blood pressure and cholesterol, burn calories to help manage her weight, gave you energy, lower stress, and improve his sleep.  The American diabetes Association (ADA) recommends being active for 2-1/2 hours (150 minutes) or more week.  Exercise for 30 minutes, 5 days a week (150 minutes total)   I recommend smoking cessation Smoking is harmful to your health and increases your risk for cancer, COPD, high blood pressure, cataracts, digestive problems, or health problems , such as gum disease, mouth sores, and tooth loss and loss of taste and smell. Smoking irritates your throat and causes coughing.    Please continue to a heart-healthy diet and increase your physical activities. Try to exercise for 45mins at least five times a week.      It was a pleasure to see you and I look forward to continuing to work together on your health and well-being. Please do not hesitate to call the office if you need care or have questions about your care.   Have a wonderful day and week. With Gratitude, [name] MSN, FNP-BC

## 2022-07-09 NOTE — Progress Notes (Signed)
Established Patient Office Visit  Subjective:  Patient ID: Travis Beltran, male    DOB: [birth year]  Age: 47 y.o. MRN: [id number]  CC:  Chief Complaint  Patient presents with   Follow-up    Pt following up states he has been taking bp medication consistently.     HPI Travis Beltran is a 47 y.o. male with past medical history of primary hypertension, tobacco use presents for f/u of chronic medical conditions. For the details of today's visit, please refer to the assessment and plan.     Past Medical History:  Diagnosis Date   Heart murmur    Hypertension     Past Surgical History:  Procedure Laterality Date   LEFT HEART CATH AND CORONARY ANGIOGRAPHY N/A 07/02/2020   Procedure: LEFT HEART CATH AND CORONARY ANGIOGRAPHY;  Surgeon: Martinique, Peter M, MD;  Location: [place] [facility];  Service: Cardiovascular;  Laterality: N/A;    Family History  Problem Relation Age of Onset   Cancer Father    Hypertension Father    Hypertension Mother     Social History   Socioeconomic History   Marital status: Married    Spouse name: Not on file   Number of children: Not on file   Years of education: Not on file   Highest education level: Not on file  Occupational History   Not on file  Tobacco Use   Smoking status: Every Day    Packs/day: 0.50    Types: Cigarettes   Smokeless tobacco: Never  Vaping Use   Vaping Use: Never used  Substance and Sexual Activity   Alcohol use: Yes    Comment: occ   Drug use: No   Sexual activity: Not on file  Other Topics Concern   Not on file  Social History Narrative   Not on file   Social Determinants of Health   Financial Resource Strain: Not on file  Food Insecurity: Not on file  Transportation Needs: Not on file  Physical Activity: Not on file  Stress: Not on file  Social Connections: Not on file  Intimate Partner Violence: Not on file    Outpatient Medications Prior to Visit  Medication Sig Dispense Refill    olmesartan-hydrochlorothiazide (BENICAR HCT) 40-12.5 MG tablet Take 1 tablet by mouth daily. 90 tablet 1   doxycycline (VIBRAMYCIN) 100 MG capsule Take 1 capsule (100 mg total) by mouth 2 (two) times daily. 14 capsule 0   No facility-administered medications prior to visit.    Allergies  Allergen Reactions   Amlodipine Besylate Rash    ROS Review of Systems  Constitutional:  Negative for fatigue and fever.  Eyes:  Negative for visual disturbance.  Respiratory:  Negative for chest tightness and shortness of breath.   Cardiovascular:  Negative for chest pain and palpitations.  Neurological:  Negative for dizziness and headaches.      Objective:    Physical Exam HENT:     Head: Normocephalic.     Right Ear: External ear normal.     Left Ear: External ear normal.     Nose: No congestion or rhinorrhea.     Mouth/Throat:     Mouth: Mucous membranes are moist.  Cardiovascular:     Rate and Rhythm: Regular rhythm.     Heart sounds: No murmur heard. Pulmonary:     Effort: No respiratory distress.     Breath sounds: Normal breath sounds.  Neurological:     Mental Status: He is alert.  BP 128/80 (BP Location: Left Arm)   Pulse 79   Ht 6\' 2"  (1.88 m)   Wt (!) 399 lb 1.9 oz (181 kg)   SpO2 92%   BMI 51.24 kg/m  Wt Readings from Last 3 Encounters:  07/09/22 (!) 399 lb 1.9 oz (181 kg)  05/10/22 (!) 398 lb (180.5 kg)  05/04/22 (!) 398 lb 3.2 oz (180.6 kg)    Lab Results  Component Value Date   TSH 0.896 04/09/2022   Lab Results  Component Value Date   WBC 9.2 04/09/2022   HGB 16.2 04/09/2022   HCT 48.3 04/09/2022   MCV 86 04/09/2022   PLT 308 04/09/2022   Lab Results  Component Value Date   NA 143 04/09/2022   K 4.3 04/09/2022   CO2 22 04/09/2022   GLUCOSE 93 04/09/2022   BUN 9 04/09/2022   CREATININE 0.94 04/09/2022   BILITOT 0.5 04/09/2022   ALKPHOS 107 04/09/2022   AST 29 04/09/2022   ALT 15 04/09/2022   PROT 7.0 04/09/2022   ALBUMIN 4.2  04/09/2022   CALCIUM 9.2 04/09/2022   ANIONGAP 6 07/29/2021   EGFR 102 04/09/2022   Lab Results  Component Value Date   CHOL 137 04/09/2022   Lab Results  Component Value Date   HDL 49 04/09/2022   Lab Results  Component Value Date   LDLCALC 74 04/09/2022   Lab Results  Component Value Date   TRIG 67 04/09/2022   Lab Results  Component Value Date   CHOLHDL 2.8 04/09/2022   Lab Results  Component Value Date   HGBA1C 5.9 (H) 04/09/2022      Assessment & Plan:  Essential hypertension Assessment & Plan: Controlled He takes olmesartan-hydrochlorothiazide 40-12.5 milligrams daily He reports adherence with treatment regimen He denies headaches, dizziness, blurred vision, chest pain, palpitation, and shortness of breath Dash diet reviewed Low-sodium diet with increased physical activities Will assess CBC and CMP today Encouraged to continue on therapy BP Readings from Last 3 Encounters:  07/09/22 128/80  05/10/22 (!) 146/98  05/04/22 (!) 149/91      Orders: -     CMP14+EGFR -     CBC with Differential/Platelet  Smoking Assessment & Plan: Patient smokes half a pack cigarettes daily, of note, he used to smoke 2 packs daily Smoking cessation encourage Education has follow: Smoking is harmful to your health and increases your risk for cancer, COPD, high blood pressure, cataracts, digestive problems, or health problems , such as gum disease, mouth sores, and tooth loss and loss of taste and smell. Smoking irritates your throat and causes coughing.    Other hyperlipidemia -     Lipid panel  IFG (impaired fasting glucose) -     Hemoglobin A1c  Vitamin D deficiency -     VITAMIN D 25 Hydroxy (Vit-D Deficiency, Fractures)  Other specified hypothyroidism -     TSH + free T4    Follow-up: Return in about 3 months (around 10/08/2022).   [name], FNP

## 2022-08-04 ENCOUNTER — Emergency Department (HOSPITAL_COMMUNITY): Payer: 59

## 2022-08-04 ENCOUNTER — Other Ambulatory Visit: Payer: Self-pay

## 2022-08-04 ENCOUNTER — Emergency Department (HOSPITAL_COMMUNITY)
Admission: EM | Admit: 2022-08-04 | Discharge: 2022-08-04 | Disposition: A | Discharge status: Home / Self Care | Payer: 59 | Attending: Emergency Medicine | Admitting: Emergency Medicine

## 2022-08-04 ENCOUNTER — Encounter (HOSPITAL_COMMUNITY): Payer: Self-pay | Admitting: *Deleted

## 2022-08-04 DIAGNOSIS — Z79899 Other long term (current) drug therapy: Secondary | ICD-10-CM | POA: Diagnosis not present

## 2022-08-04 DIAGNOSIS — K047 Periapical abscess without sinus: Secondary | ICD-10-CM | POA: Diagnosis not present

## 2022-08-04 DIAGNOSIS — K029 Dental caries, unspecified: Secondary | ICD-10-CM | POA: Diagnosis not present

## 2022-08-04 DIAGNOSIS — R220 Localized swelling, mass and lump, head: Secondary | ICD-10-CM

## 2022-08-04 DIAGNOSIS — I10 Essential (primary) hypertension: Secondary | ICD-10-CM | POA: Insufficient documentation

## 2022-08-04 LAB — BASIC METABOLIC PANEL
Anion gap: 9 (ref 5–15)
BUN: 9 mg/dL (ref 6–20)
CO2: 26 mmol/L (ref 22–32)
Calcium: 8.6 mg/dL — ABNORMAL LOW (ref 8.9–10.3)
Chloride: 103 mmol/L (ref 98–111)
Creatinine, Ser: 0.97 mg/dL (ref 0.61–1.24)
GFR, Estimated: >60 mL/min (ref >60)
Glucose, Bld: 104 mg/dL — ABNORMAL HIGH (ref 70–99)
Potassium: 3.9 mmol/L (ref 3.5–5.1)
Sodium: 138 mmol/L (ref 135–145)

## 2022-08-04 LAB — I-STAT CHEM 8, ED
BUN: 7 mg/dL (ref 6–20)
Calcium, Ion: 1.16 mmol/L (ref 1.15–1.40)
Chloride: 100 mmol/L (ref 98–111)
Creatinine, Ser: 0.9 mg/dL (ref 0.61–1.24)
Glucose, Bld: 102 mg/dL — ABNORMAL HIGH (ref 70–99)
HCT: 48 % (ref 39.0–52.0)
Hemoglobin: 16.3 g/dL (ref 13.0–17.0)
Potassium: 4 mmol/L (ref 3.5–5.1)
Sodium: 141 mmol/L (ref 135–145)
TCO2: 27 mmol/L (ref 22–32)

## 2022-08-04 LAB — CBC
HCT: 47.5 % (ref 39.0–52.0)
Hemoglobin: 15.4 g/dL (ref 13.0–17.0)
MCH: 28.3 pg (ref 26.0–34.0)
MCHC: 32.4 g/dL (ref 30.0–36.0)
MCV: 87.3 fL (ref 80.0–100.0)
Platelets: 240 10*3/uL (ref 150–400)
RBC: 5.44 MIL/uL (ref 4.22–5.81)
RDW: 14.5 % (ref 11.5–15.5)
WBC: 10.7 10*3/uL — ABNORMAL HIGH (ref 4.0–10.5)
nRBC: 0 % (ref 0.0–0.2)

## 2022-08-04 MED ORDER — OXYCODONE HCL 5 MG PO TABS: 5.0000 mg | ORAL_TABLET | Freq: Four times a day (QID) | ORAL | 0 refills | Status: DC | PRN

## 2022-08-04 MED ORDER — AMOXICILLIN-POT CLAVULANATE 875-125 MG PO TABS: 1.0000 | ORAL_TABLET | Freq: Two times a day (BID) | ORAL | 0 refills | Status: DC

## 2022-08-04 MED ORDER — IOHEXOL 300 MG/ML  SOLN
75.0000 mL | Freq: Once | INTRAMUSCULAR | Status: AC | PRN
  Administered 2022-08-04: 75 mL via INTRAVENOUS

## 2022-08-04 MED ORDER — KETOROLAC TROMETHAMINE 15 MG/ML IJ SOLN
15.0000 mg | Freq: Once | INTRAMUSCULAR | Status: AC
  Administered 2022-08-04: 15 mg via INTRAVENOUS
  Filled 2022-08-04: qty 1

## 2022-08-04 MED ORDER — OXYCODONE HCL 5 MG PO TABS
5.0000 mg | ORAL_TABLET | ORAL | Status: AC
  Administered 2022-08-04: 5 mg via ORAL
  Filled 2022-08-04: qty 1

## 2022-08-04 NOTE — ED Provider Notes (Signed)
[name] Provider Note   CSN: [id number] Arrival date & time: 08/04/22  1327     History  Chief Complaint  Patient presents with   Facial Swelling    Travis Beltran is a 47 y.o. male with history of HTN who presents to the ER for facial swelling and dental pain x 2 days.  Says he has had similar symptoms in the past.  He notes he has several cavities, but has not been to a dentist in 10 years or so.  He says he has been told in the past need to see an oral surgeon and finally now has insurance that could cover that, but has been having difficulty finding one.  Denies any fevers or chills.  Took some Tylenol last night with some relief.  No difficulty swallowing or breathing.  HPI     Home Medications Prior to Admission medications   Medication Sig Start Date End Date Taking? Authorizing Provider  amoxicillin-clavulanate (AUGMENTIN) 875-125 MG tablet Take 1 tablet by mouth every 12 (twelve) hours. 08/04/22  Yes Magan Winnett T, PA-C  oxyCODONE (ROXICODONE) 5 MG immediate release tablet Take 1 tablet (5 mg total) by mouth every 6 (six) hours as needed for severe pain. 08/04/22  Yes Council Munguia T, PA-C  olmesartan-hydrochlorothiazide (BENICAR HCT) 40-12.5 MG tablet Take 1 tablet by mouth daily. 05/04/22 10/31/22  Johnette Abraham, MD  predniSONE (STERAPRED UNI-PAK 21 TAB) 10 MG (21) TBPK tablet 4m Tabs, 6 day taper. Use as directed Patient not taking: Reported on 11/03/2021 12/14/20   STruddie Hidden MD      Allergies    Amlodipine besylate    Review of Systems   Review of Systems  HENT:  Positive for dental problem and facial swelling.   Neurological:  Positive for headaches.  All other systems reviewed and are negative.   Physical Exam Updated Vital Signs BP (!) 170/116 (BP Location: Right Arm)   Pulse 87   Temp 99.6 F (37.6 C) (Oral)   Resp 18   SpO2 98%  Physical Exam Vitals and nursing note reviewed.   Constitutional:      Appearance: Normal appearance.  HENT:     Head: Normocephalic and atraumatic.     Comments: Swelling of the left zygomatic area    Mouth/Throat:     Lips: Pink.     Dentition: Abnormal dentition. Dental caries present.     Pharynx: Oropharynx is clear.     Tonsils: No tonsillar exudate or tonsillar abscesses.     Comments: Poor dentition overall. Multiple caries noted including left upper canine with gingival disease. No dental abscess visualized or palpated.   No PTA. No sublingual or submandibular swelling.  Eyes:     Conjunctiva/sclera: Conjunctivae normal.  Pulmonary:     Effort: Pulmonary effort is normal. No respiratory distress.  Skin:    General: Skin is warm and dry.  Neurological:     Mental Status: He is alert.  Psychiatric:        Mood and Affect: Mood normal.        Behavior: Behavior normal.     ED Results / Procedures / Treatments   Labs (all labs ordered are listed, but only abnormal results are displayed) Labs Reviewed  CBC - Abnormal; Notable for the following components:      Result Value   WBC 10.7 (*)    All other components within normal limits  BASIC METABOLIC PANEL -  Abnormal; Notable for the following components:   Glucose, Bld 104 (*)    Calcium 8.6 (*)    All other components within normal limits  I-STAT CHEM 8, ED - Abnormal; Notable for the following components:   Glucose, Bld 102 (*)    All other components within normal limits    EKG None  Radiology CT Maxillofacial W Contrast  Result Date: 08/04/2022 CLINICAL DATA:  Left upper facial swelling. Concern for left upper canine abscess/facial abscess. EXAM: CT MAXILLOFACIAL WITH CONTRAST TECHNIQUE: Multidetector CT imaging of the maxillofacial structures was performed with intravenous contrast. Multiplanar CT image reconstructions were also generated. RADIATION DOSE REDUCTION: This exam was performed according to the departmental dose-optimization program which  includes automated exposure control, adjustment of the mA and/or kV according to patient size and/or use of iterative reconstruction technique. CONTRAST:  67m OMNIPAQUE IOHEXOL 300 MG/ML  SOLN COMPARISON:  None Available. FINDINGS: Osseous: Periapical lucency of the bilateral maxillary first bicuspids with anterior cortical breakthrough. Periapical lucency of the left maxillary canine and left maxillary second bicuspid. Extensive dental caries. No facial fracture. Orbits: Unremarkable. Sinuses: Mild mucosal disease in the left maxillary sinus with occlusion of the ostiomeatal complex. Soft tissues: Moderate subcutaneous edema and swelling centered around the left malar soft tissues. No discrete fluid collection. Limited intracranial: Unremarkable. IMPRESSION: Moderate subcutaneous edema and swelling centered around the left malar soft tissues, compatible with odontogenic cellulitis in the setting of left-greater-than-right maxillary periodontal disease. No discrete fluid collection. Electronically Signed   By: WEmmit AlexandersM.D.   On: 08/04/2022 15:22    Procedures Procedures    Medications Ordered in ED Medications  ketorolac (TORADOL) 15 MG/ML injection 15 mg (15 mg Intravenous Given 08/04/22 1432)  oxyCODONE (Oxy IR/ROXICODONE) immediate release tablet 5 mg (5 mg Oral Given 08/04/22 1432)  iohexol (OMNIPAQUE) 300 MG/ML solution 75 mL (75 mLs Intravenous Contrast Given 08/04/22 1514)    ED Course/ Medical Decision Making/ A&P                             Medical Decision Making Amount and/or Complexity of Data Reviewed Labs: ordered. Radiology: ordered.  Risk Prescription drug management.  This patient is a 47y.o. male who presents to the ED for concern of dental problem.   Differential diagnoses prior to evaluation: Dental abscess, dental caries, dental fracture, PTA, ludwig's angina  Past Medical History / Social History / Additional history: Chart reviewed. Pertinent results  include: HTN  Physical Exam: Physical exam performed. The pertinent findings include: poor dentition overall with multiple caries including left upper canine and molar. No abscess visualized. No sublingual or submandibular swelling.   Labs: I reviewed the pertinent results of labs ordered from triage including: mild leukocytosis of 10.7, normal hemoglobin. Unremarkable BMP.   Imaging: CT imaging ordered from triage shows facial swelling due to odontogenic disease without abscess or fluid collection. I reviewed and interpreted the images and agree with the radiologist interpretation.   Medications: Given toradol and oxycodone in triage. Patient reported relief of pain on my evaluation. I reviewed patient's home medications and have made adjustments as needed.   Disposition: After consideration of the diagnostic results and the patients response to treatment, I feel that emergency department workup does not suggest an emergent condition requiring admission or immediate intervention beyond what has been performed at this time. The plan is: discharge to home with antibiotics, encouraged anti-inflammatories,  prescribed pain medication. Given dental  resource guide. The patient is safe for discharge and has been instructed to return immediately for worsening symptoms, change in symptoms or any other concerns.   Final Clinical Impression(s) / ED Diagnoses Final diagnoses:  Facial swelling  Dental infection    Rx / DC Orders ED Discharge Orders          Ordered    amoxicillin-clavulanate (AUGMENTIN) 875-125 MG tablet  Every 12 hours        08/04/22 1555    oxyCODONE (ROXICODONE) 5 MG immediate release tablet  Every 6 hours PRN        08/04/22 1555           Portions of this report may have been transcribed using voice recognition software. Every effort was made to ensure accuracy; however, inadvertent computerized transcription errors may be present.    Estill Cotta 08/04/22 1600    Fransico Meadow, MD 08/09/22 [phone]

## 2022-08-04 NOTE — ED Notes (Signed)
Patient transported to CT 

## 2022-08-04 NOTE — ED Provider Notes (Addendum)
Performed initial evaluation of this patient.  47 year old male who presents emergency department with left-sided facial swelling and dental pain.  Reports that this started recently and that he has had worsening swelling over his left zygomatic area.  Does have apparent cavity to his left upper canine with swelling of his face.  Denies any vision changes.  EOM are intact.  No palpable abscess noted on exam of his gingiva.  Will place pain medication orders and CT scan to evaluate for abscess.  See provider note for additional details.   Fransico Meadow, MD 08/04/22 [phone]

## 2022-08-04 NOTE — Discharge Instructions (Addendum)
You were seen in the ER for dental infection and facial swelling.   Your CT scan showed the swelling, but no abscess. I am prescribing you a short course of pain medication and antibiotics.  Continue to monitor how you're doing and return to the ER for new or worsening symptoms such as fevers, difficulty swallowing or breathing.

## 2022-08-04 NOTE — ED Triage Notes (Signed)
Pt is here for left upper facial swelling.  Swelling noted in left upper jaw, cheek and to left eye.  Pt states that he feels he has a dental abscess from broken tooth.  Pain reports severe pain which began yesterday.  Temp in triage 99.26F

## 2022-08-05 ENCOUNTER — Telehealth: Payer: Self-pay

## 2022-08-05 NOTE — Transitions of Care (Post Inpatient/ED Visit) (Unsigned)
   08/05/2022  Name: Travis Beltran MRN: [id number] DOB: 02-16-1976  Today's TOC FU Call Status: Today's TOC FU Call Status:: Unsuccessul Call (1st Attempt) Unsuccessful Call (1st Attempt) Date: 08/05/22  Attempted to reach the patient regarding the most recent Inpatient/ED visit.  Follow Up Plan: Additional outreach attempts will be made to reach the patient to complete the Transitions of Care (Post Inpatient/ED visit) call.   [place] LPN [place] Advisor Direct Dial [phone]

## 2022-08-06 NOTE — Transitions of Care (Post Inpatient/ED Visit) (Signed)
   08/06/2022  Name: Travis Beltran MRN: [id number] DOB: 16-Apr-1976  Today's TOC FU Call Status: Today's TOC FU Call Status:: Unsuccessful Call (2nd Attempt) Unsuccessful Call (1st Attempt) Date: 08/05/22 Unsuccessful Call (2nd Attempt) Date: 08/06/22  Attempted to reach the patient regarding the most recent Inpatient/ED visit.  Follow Up Plan: No further outreach attempts will be made at this time. We have been unable to contact the patient.  [place] LPN [place] Advisor Direct Dial [phone]

## 2022-08-31 ENCOUNTER — Encounter: Payer: Self-pay | Admitting: Family Medicine

## 2022-08-31 ENCOUNTER — Ambulatory Visit (INDEPENDENT_AMBULATORY_CARE_PROVIDER_SITE_OTHER): Payer: PRIVATE HEALTH INSURANCE | Admitting: Family Medicine

## 2022-08-31 VITALS — BP 162/98 | HR 82 | Ht 74.0 in | Wt 395.1 lb

## 2022-08-31 DIAGNOSIS — I10 Essential (primary) hypertension: Secondary | ICD-10-CM

## 2022-08-31 MED ORDER — OLMESARTAN MEDOXOMIL-HCTZ 40-12.5 MG PO TABS: 1.0000 | ORAL_TABLET | Freq: Every day | ORAL | 1 refills | Status: DC

## 2022-08-31 NOTE — Progress Notes (Signed)
Established Patient Office Visit  Subjective:  Patient ID: Travis Beltran, male    DOB: January 13, 1976  Age: 47 y.o. MRN: [id number]  CC:  Chief Complaint  Patient presents with   Hypertension    Refills for bp.    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     HPI Travis Beltran is a 47 y.o. male with past medical history of essential hypertension, presents for f/u of  chronic medical conditions.    Past Medical History:  Diagnosis Date   Heart murmur    Hypertension     Past Surgical History:  Procedure Laterality Date   LEFT HEART CATH AND CORONARY ANGIOGRAPHY N/A 07/02/2020   Procedure: LEFT HEART CATH AND CORONARY ANGIOGRAPHY;  Surgeon: Martinique, Peter M, MD;  Location: [facility];  Service: Cardiovascular;  Laterality: N/A;    Family History  Problem Relation Age of Onset   Cancer Father    Hypertension Father    Hypertension Mother     Social History   Socioeconomic History   Marital status: Married    Spouse name: Not on file   Number of children: Not on file   Years of education: Not on file   Highest education level: Not on file  Occupational History   Not on file  Tobacco Use   Smoking status: Every Day    Packs/day: 0.50    Types: Cigarettes   Smokeless tobacco: Never  Vaping Use   Vaping Use: Never used  Substance and Sexual Activity   Alcohol use: Yes    Comment: occ   Drug use: No   Sexual activity: Not on file  Other Topics Concern   Not on file  Social History Narrative   Not on file   Social Determinants of Health   Financial Resource Strain: Not on file  Food Insecurity: Not on file  Transportation Needs: Not on file  Physical Activity: Not on file  Stress: Not on file  Social Connections: Not on file  Intimate Partner Violence: Not on file    Outpatient Medications Prior to  Visit  Medication Sig Dispense Refill   olmesartan-hydrochlorothiazide (BENICAR HCT) 40-12.5 MG tablet Take 1 tablet by mouth daily. 90 tablet 1   amoxicillin-clavulanate (AUGMENTIN) 875-125 MG tablet Take 1 tablet by mouth every 12 (twelve) hours. 14 tablet 0   oxyCODONE (ROXICODONE) 5 MG immediate release tablet Take 1 tablet (5 mg total) by mouth every 6 (six) hours as needed for severe pain. 5 tablet 0   No facility-administered medications prior to visit.    Allergies  Allergen Reactions   Amlodipine Besylate Rash    ROS Review of Systems  Constitutional:  Negative for fatigue and fever.  Eyes:  Negative for visual disturbance.  Respiratory:  Negative for chest tightness and shortness of breath.   Cardiovascular:  Negative for chest pain and palpitations.  Neurological:  Negative for dizziness and headaches.      Objective:    Physical Exam HENT:     Head: Normocephalic.     Right Ear: External ear normal.     Left Ear: External ear normal.     Nose: No congestion or rhinorrhea.     Mouth/Throat:     Mouth: Mucous membranes are moist.  Cardiovascular:     Rate and Rhythm: Regular rhythm.  Heart sounds: No murmur heard. Pulmonary:     Effort: No respiratory distress.     Breath sounds: Normal breath sounds.  Neurological:     Mental Status: He is alert.     BP (!) 162/98 (BP Location: Left Arm)   Pulse 82   Ht '6\' 2"'$  (1.88 m)   Wt (!) 395 lb 1.3 oz (179.2 kg)   SpO2 94%   BMI 50.73 kg/m  Wt Readings from Last 3 Encounters:  08/31/22 (!) 395 lb 1.3 oz (179.2 kg)  07/09/22 (!) 399 lb 1.9 oz (181 kg)  05/10/22 (!) 398 lb (180.5 kg)    Lab Results  Component Value Date   TSH 0.896 04/09/2022   Lab Results  Component Value Date   WBC 10.7 (H) 08/04/2022   HGB 16.3 08/04/2022   HCT 48.0 08/04/2022   MCV 87.3 08/04/2022   PLT 240 08/04/2022   Lab Results  Component Value Date   NA 141 08/04/2022   K 4.0 08/04/2022   CO2 26 08/04/2022   GLUCOSE  102 (H) 08/04/2022   BUN 7 08/04/2022   CREATININE 0.90 08/04/2022   BILITOT 0.5 04/09/2022   ALKPHOS 107 04/09/2022   AST 29 04/09/2022   ALT 15 04/09/2022   PROT 7.0 04/09/2022   ALBUMIN 4.2 04/09/2022   CALCIUM 8.6 (L) 08/04/2022   ANIONGAP 9 08/04/2022   EGFR 102 04/09/2022   Lab Results  Component Value Date   CHOL 137 04/09/2022   Lab Results  Component Value Date   HDL 49 04/09/2022   Lab Results  Component Value Date   LDLCALC 74 04/09/2022   Lab Results  Component Value Date   TRIG 67 04/09/2022   Lab Results  Component Value Date   CHOLHDL 2.8 04/09/2022   Lab Results  Component Value Date   HGBA1C 5.9 (H) 04/09/2022      Assessment & Plan:  Essential hypertension Assessment & Plan: Uncontrolled He reports skipping his medication for 6 days The patient is asymptomatic today in the clinic 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  Dispense: 90 tablet; Refill: 1 -     BMP8+EGFR    Follow-up: Return in about 2 weeks (around 09/14/2022) for BP.   [name], FNP

## 2022-08-31 NOTE — Patient Instructions (Addendum)
I appreciate the opportunity to provide care to you today!    Follow up: 2 weeks for bp  Labs: please stop by the lab today to get your blood drawn ( BMP)  Please take your blood pressure medication daily I will follow-up on your blood pressure in 2 weeks and inquire about the DOT forms I recommend low-sodium diet with increased physical activity    Please continue to a heart-healthy diet and increase your physical activities. Try to exercise for 52mns at least five times a week.      It was a pleasure to see you and I look forward to continuing to work together on your health and well-being. Please do not hesitate to call the office if you need care or have questions about your care.   Have a wonderful day and week. With Gratitude, GAlvira MondayMSN, FNP-BC

## 2022-08-31 NOTE — Assessment & Plan Note (Signed)
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

## 2022-09-01 LAB — BMP8+EGFR
BUN/Creatinine Ratio: 7 — ABNORMAL LOW (ref 9–20)
BUN: 7 mg/dL (ref 6–24)
CO2: 24 mmol/L (ref 20–29)
Calcium: 9.1 mg/dL (ref 8.7–10.2)
Chloride: 103 mmol/L (ref 96–106)
Creatinine, Ser: 0.95 mg/dL (ref 0.76–1.27)
Glucose: 100 mg/dL — ABNORMAL HIGH (ref 70–99)
Potassium: 4.4 mmol/L (ref 3.5–5.2)
Sodium: 141 mmol/L (ref 134–144)
eGFR: 100 mL/min/{1.73_m2} (ref >59)

## 2022-09-01 NOTE — Progress Notes (Signed)
Please inform the patient his labs are stable

## 2022-09-14 ENCOUNTER — Ambulatory Visit (INDEPENDENT_AMBULATORY_CARE_PROVIDER_SITE_OTHER): Payer: 59 | Admitting: Family Medicine

## 2022-09-14 ENCOUNTER — Encounter: Payer: Self-pay | Admitting: Family Medicine

## 2022-09-14 VITALS — BP 138/90 | HR 119 | Ht 74.0 in | Wt 388.1 lb

## 2022-09-14 DIAGNOSIS — I10 Essential (primary) hypertension: Secondary | ICD-10-CM | POA: Diagnosis not present

## 2022-09-14 NOTE — Progress Notes (Signed)
Established Patient Office Visit  Subjective:  Patient ID: Travis Beltran, male    DOB: 1976/03/17  Age: 47 y.o. MRN: [id number]  CC:  Chief Complaint  Patient presents with   Follow-up    2 week f/u for htn.     HPI Travis Beltran is a 47 y.o. male with past medical history of hypertension presents for for blood pressure follow-up. For the details of today's visit, please refer to the assessment and plan.  Wt Readings from Last 3 Encounters:  09/14/22 (!) 388 lb 1.3 oz (176 kg)  08/31/22 (!) 395 lb 1.3 oz (179.2 kg)  07/09/22 (!) 399 lb 1.9 oz (181 kg)        Past Medical History:  Diagnosis Date   Heart murmur    Hypertension     Past Surgical History:  Procedure Laterality Date   LEFT HEART CATH AND CORONARY ANGIOGRAPHY N/A 07/02/2020   Procedure: LEFT HEART CATH AND CORONARY ANGIOGRAPHY;  Surgeon: Martinique, Peter M, MD;  Location: [facility];  Service: Cardiovascular;  Laterality: N/A;    Family History  Problem Relation Age of Onset   Cancer Father    Hypertension Father    Hypertension Mother     Social History   Socioeconomic History   Marital status: Married    Spouse name: Not on file   Number of children: Not on file   Years of education: Not on file   Highest education level: Not on file  Occupational History   Not on file  Tobacco Use   Smoking status: Every Day    Packs/day: .5    Types: Cigarettes   Smokeless tobacco: Never  Vaping Use   Vaping Use: Never used  Substance and Sexual Activity   Alcohol use: Yes    Comment: occ   Drug use: No   Sexual activity: Not on file  Other Topics Concern   Not on file  Social History Narrative   Not on file   Social Determinants of Health   Financial Resource Strain: Not on file  Food Insecurity: Not on file  Transportation Needs: Not on file  Physical Activity: Not on file  Stress: Not on file  Social Connections: Not on file  Intimate Partner Violence: Not on file     Outpatient Medications Prior to Visit  Medication Sig Dispense Refill   olmesartan-hydrochlorothiazide (BENICAR HCT) 40-12.5 MG tablet Take 1 tablet by mouth daily. 90 tablet 1   No facility-administered medications prior to visit.    Allergies  Allergen Reactions   Amlodipine Besylate Rash    ROS Review of Systems  Constitutional:  Negative for fatigue and fever.  Eyes:  Negative for visual disturbance.  Respiratory:  Negative for chest tightness and shortness of breath.   Cardiovascular:  Negative for chest pain and palpitations.  Neurological:  Negative for dizziness and headaches.      Objective:    Physical Exam HENT:     Head: Normocephalic.     Right Ear: External ear normal.     Left Ear: External ear normal.     Nose: No congestion or rhinorrhea.     Mouth/Throat:     Mouth: Mucous membranes are moist.  Cardiovascular:     Rate and Rhythm: Regular rhythm.     Heart sounds: No murmur heard. Pulmonary:     Effort: No respiratory distress.     Breath sounds: Normal breath sounds.  Neurological:     Mental  Status: He is alert.     BP (!) 138/90 (BP Location: Left Arm)   Pulse (!) 119   Ht 6\' 2"  (1.88 m)   Wt (!) 388 lb 1.3 oz (176 kg)   SpO2 97%   BMI 49.83 kg/m  Wt Readings from Last 3 Encounters:  09/14/22 (!) 388 lb 1.3 oz (176 kg)  08/31/22 (!) 395 lb 1.3 oz (179.2 kg)  07/09/22 (!) 399 lb 1.9 oz (181 kg)    Lab Results  Component Value Date   TSH 0.896 04/09/2022   Lab Results  Component Value Date   WBC 10.7 (H) 08/04/2022   HGB 16.3 08/04/2022   HCT 48.0 08/04/2022   MCV 87.3 08/04/2022   PLT 240 08/04/2022   Lab Results  Component Value Date   NA 141 08/31/2022   K 4.4 08/31/2022   CO2 24 08/31/2022   GLUCOSE 100 (H) 08/31/2022   BUN 7 08/31/2022   CREATININE 0.95 08/31/2022   BILITOT 0.5 04/09/2022   ALKPHOS 107 04/09/2022   AST 29 04/09/2022   ALT 15 04/09/2022   PROT 7.0 04/09/2022   ALBUMIN 4.2 04/09/2022    CALCIUM 9.1 08/31/2022   ANIONGAP 9 08/04/2022   EGFR 100 08/31/2022   Lab Results  Component Value Date   CHOL 137 04/09/2022   Lab Results  Component Value Date   HDL 49 04/09/2022   Lab Results  Component Value Date   LDLCALC 74 04/09/2022   Lab Results  Component Value Date   TRIG 67 04/09/2022   Lab Results  Component Value Date   CHOLHDL 2.8 04/09/2022   Lab Results  Component Value Date   HGBA1C 5.9 (H) 04/09/2022      Assessment & Plan:  Essential hypertension Assessment & Plan: Uncontrolled Reports medication adherence Denies headaches, dizziness, blurred vision, chest pain, palpitation Patient declines changes in his medication regimen The patient has lost 7 pounds since his last visit Congratulated patient on the weight loss Encourage to continue lifestyle modification with low-sodium diet increase physical activity We will follow-up in 3 months     Follow-up: Return in about 3 months (around 12/15/2022).   [name], FNP

## 2022-09-14 NOTE — Progress Notes (Signed)
   Established Patient Office Visit  Subjective   Patient ID: Travis Beltran, male    DOB: 01-30-1976  Age: 47 y.o. MRN: [id number]  Chief Complaint  Patient presents with   Follow-up    2 week f/u for htn.     HPI  Travis Beltran 47 year old male, with past medical history of hypertension, sleep apnea and peripheral vascular disease, who presents for 2 week BP follow-up. Patient reports compliance with medication, with little change to diet. Reports increasing physical activity to 30 min daily.    Review of Systems  Constitutional: Negative.   Respiratory: Negative.    Cardiovascular: Negative.   Neurological: Negative.   Psychiatric/Behavioral: Negative.        Objective:     BP (!) 138/90 (BP Location: Left Arm)   Pulse (!) 119   Ht 6\' 2"  (1.88 m)   Wt (!) 388 lb 1.3 oz (176 kg)   SpO2 97%   BMI 49.83 kg/m  BP Readings from Last 3 Encounters:  09/14/22 (!) 138/90  08/31/22 (!) 162/98  08/04/22 (!) 170/116      Physical Exam Constitutional:      Appearance: Normal appearance.  Cardiovascular:     Rate and Rhythm: Normal rate and regular rhythm.  Pulmonary:     Effort: Pulmonary effort is normal.     Breath sounds: Normal breath sounds.  Neurological:     General: No focal deficit present.     Mental Status: He is alert and oriented to person, place, and time. Mental status is at baseline.  Psychiatric:        Mood and Affect: Mood normal.        Behavior: Behavior normal.      No results found for any visits on 09/14/22.  Last CBC Lab Results  Component Value Date   WBC 10.7 (H) 08/04/2022   HGB 16.3 08/04/2022   HCT 48.0 08/04/2022   MCV 87.3 08/04/2022   MCH 28.3 08/04/2022   RDW 14.5 08/04/2022   PLT 240 [id number]   Last metabolic panel Lab Results  Component Value Date   GLUCOSE 100 (H) 08/31/2022   NA 141 08/31/2022   K 4.4 08/31/2022   CL 103 08/31/2022   CO2 24 08/31/2022   BUN 7 08/31/2022   CREATININE 0.95  08/31/2022   EGFR 100 08/31/2022   CALCIUM 9.1 08/31/2022   PROT 7.0 04/09/2022   ALBUMIN 4.2 04/09/2022   LABGLOB 2.8 04/09/2022   AGRATIO 1.5 04/09/2022   BILITOT 0.5 04/09/2022   ALKPHOS 107 04/09/2022   AST 29 04/09/2022   ALT 15 04/09/2022   ANIONGAP 9 08/04/2022      The ASCVD Risk score (Arnett DK, et al., 2019) failed to calculate for the following reasons:   The patient has a prior MI or stroke diagnosis    Assessment & Plan:   Problem List Items Addressed This Visit       Cardiovascular and Mediastinum   Essential hypertension - Primary    Uncontrolled Reports medication adherence Denies headaches, dizziness, blurred vision, chest pain, palpitation Patient declines changes in his medication regimen The patient has lost 7 pounds since his last visit Congratulated patient on the weight loss Encourage to continue lifestyle modification with low-sodium diet increase physical activity We will follow-up in 3 months       Return in about 3 months (around 12/15/2022).    Brandt Loosen, RN

## 2022-09-14 NOTE — Assessment & Plan Note (Signed)
Uncontrolled Reports medication adherence Denies headaches, dizziness, blurred vision, chest pain, palpitation Patient declines changes in his medication regimen The patient has lost 7 pounds since his last visit Congratulated patient on the weight loss Encourage to continue lifestyle modification with low-sodium diet increase physical activity We will follow-up in 3 months

## 2022-09-14 NOTE — Progress Notes (Deleted)
   Established Patient Office Visit  Subjective   Patient ID: Travis Beltran, male    DOB: [birth year]  Age: 47 y.o. MRN: [id number]  Chief Complaint  Patient presents with   Follow-up    2 week f/u for htn.     HPI  {History (Optional):23778}  ROS    Objective:     BP (!) 138/90 (BP Location: Left Arm)   Pulse (!) 119   Ht 6\' 2"  (1.88 m)   Wt (!) 388 lb 1.3 oz (176 kg)   SpO2 97%   BMI 49.83 kg/m  BP Readings from Last 3 Encounters:  09/14/22 (!) 138/90  08/31/22 (!) 162/98  08/04/22 (!) 170/116      Physical Exam   No results found for any visits on 09/14/22.  {Labs (Optional):23779}  The ASCVD Risk score (Arnett DK, et al., 2019) failed to calculate for the following reasons:   The patient has a prior MI or stroke diagnosis    Assessment & Plan:   Problem List Items Addressed This Visit   None   No follow-ups on file.    Brandt Loosen, RN

## 2022-09-14 NOTE — Patient Instructions (Signed)
I appreciate the opportunity to provide care to you today!    Follow up:  3 months  I recommend low-sodium diet with increased physical activity  Keep up the good work on losing weight and adhering to a low-sodium diet!   Please continue to a heart-healthy diet and increase your physical activities. Try to exercise for 8mins at least five days a week.   Physical activity helps: Lower your blood glucose, improve your heart health, lower your blood pressure and cholesterol, burn calories to help manage her weight, gave you energy, lower stress, and improve his sleep.  The American diabetes Association (ADA) recommends being active for 2-1/2 hours (150 minutes) or more week.  Exercise for 30 minutes, 5 days a week (150 minutes total)     It was a pleasure to see you and I look forward to continuing to work together on your health and well-being. Please do not hesitate to call the office if you need care or have questions about your care.   Have a wonderful day and week. With Gratitude, [name] MSN, FNP-BC

## 2022-10-05 ENCOUNTER — Ambulatory Visit (INDEPENDENT_AMBULATORY_CARE_PROVIDER_SITE_OTHER): Payer: PRIVATE HEALTH INSURANCE | Admitting: Family Medicine

## 2022-10-05 ENCOUNTER — Encounter: Payer: Self-pay | Admitting: Family Medicine

## 2022-10-05 VITALS — BP 128/82 | HR 96 | Ht 74.0 in | Wt 389.1 lb

## 2022-10-05 DIAGNOSIS — L309 Dermatitis, unspecified: Secondary | ICD-10-CM | POA: Diagnosis not present

## 2022-10-05 DIAGNOSIS — Z0001 Encounter for general adult medical examination with abnormal findings: Secondary | ICD-10-CM | POA: Diagnosis not present

## 2022-10-05 MED ORDER — TRIAMCINOLONE ACETONIDE 0.1 % EX CREA: 1.0000 | TOPICAL_CREAM | Freq: Two times a day (BID) | CUTANEOUS | 0 refills | Status: DC

## 2022-10-05 NOTE — Patient Instructions (Addendum)
  I appreciate the opportunity to provide care to you today!    Follow up:  4 months  Please pick up your cream at the pharmacy  Please call [phone] for colonoscopy appt.    Please continue to a heart-healthy diet and increase your physical activities. Try to exercise for at least five days a week.      It was a pleasure to see you and I look forward to continuing to work together on your health and well-being. Please do not hesitate to call the office if you need care or have questions about your care.   Have a wonderful day and week. With Gratitude, Gilmore Laroche MSN, FNP-BC

## 2022-10-05 NOTE — Progress Notes (Unsigned)
Complete physical exam  Patient: Travis Beltran   DOB: 1975/10/13   47 y.o. Male  MRN: [id number]  Subjective:    Chief Complaint  Patient presents with   Annual Exam    Cpe today.     Travis Beltran is a 47 y.o. male who presents today for a complete physical exam. He reports consuming a general diet. Home exercise routine includes walking 4 days a week for 1  hrs per day.    He generally feels well. He reports sleeping poorly due to his work. He does not have additional problems to discuss today.    Most recent fall risk assessment:    10/05/2022    1:56 PM  Fall Risk   Falls in the past year? 0  Number falls in past yr: 0  Injury with Fall? 0  Risk for fall due to : No Fall Risks  Follow up Falls evaluation completed     Most recent depression screenings:    10/05/2022    1:56 PM 09/14/2022    8:51 AM  PHQ 2/9 Scores  PHQ - 2 Score 0 0  PHQ- 9 Score 0 0    Dental: No current dental problems and No regular dental care   Patient Active Problem List   Diagnosis Date Noted   Screening for colon cancer 05/04/2022   Allergic rhinitis 02/26/2022   Acute pain of left shoulder 02/26/2022   Allergic sinusitis 11/03/2021   Sleep apnea 11/03/2021   Peripheral vascular disease 10/14/2021   Wound of lower extremity, right, initial encounter 10/14/2021   Essential hypertension 10/14/2021   URI (upper respiratory infection) 10/14/2021   Smoking 10/14/2021   NSTEMI (non-ST elevated myocardial infarction) 06/29/2020   Past Medical History:  Diagnosis Date   Heart murmur    Hypertension    Past Surgical History:  Procedure Laterality Date   LEFT HEART CATH AND CORONARY ANGIOGRAPHY N/A 07/02/2020   Procedure: LEFT HEART CATH AND CORONARY ANGIOGRAPHY;  Surgeon: Swazil[place], Peter M, MD;  Location: [facility] INVASIVE CV LAB;  Service: Cardiovascular;  Laterality: N/A;   Social History   Tobacco Use   Smoking status: Every Day    Packs/day: .5    Types: Cigarettes    Smokeless tobacco: Never  Vaping Use   Vaping Use: Never used  Substance Use Topics   Alcohol use: Yes    Comment: occ   Drug use: No   Social History   Socioeconomic History   Marital status: Married    Spouse name: Not on file   Number of children: Not on file   Years of education: Not on file   Highest education level: Not on file  Occupational History   Not on file  Tobacco Use   Smoking status: Every Day    Packs/day: .5    Types: Cigarettes   Smokeless tobacco: Never  Vaping Use   Vaping Use: Never used  Substance and Sexual Activity   Alcohol use: Yes    Comment: occ   Drug use: No   Sexual activity: Not on file  Other Topics Concern   Not on file  Social History Narrative   Not on file   Social Determinants of Health   Financial Resource Strain: Not on file  Food Insecurity: Not on file  Transportation Needs: Not on file  Physical Activity: Not on file  Stress: Not on file  Social Connections: Not on file  Intimate Partner Violence: Not on file  Family Status  Relation Name Status   Father  Deceased   Mother  Alive   Family History  Problem Relation Age of Onset   Cancer Father    Hypertension Father    Hypertension Mother    Allergies  Allergen Reactions   Amlodipine Besylate Rash      Patient Care Team: Gilmore Laroche, FNP as PCP - General (Family Medicine) Wyline Mood Dorothe Pea, MD as PCP - Cardiology (Cardiology)   Outpatient Medications Prior to Visit  Medication Sig   olmesartan-hydrochlorothiazide (BENICAR HCT) 40-12.5 MG tablet Take 1 tablet by mouth daily.   No facility-administered medications prior to visit.    Review of Systems  Constitutional:  Negative for chills, fever and malaise/fatigue.  HENT:  Negative for congestion and sinus pain.   Eyes:  Negative for pain, discharge and redness.  Respiratory:  Negative for cough, sputum production and shortness of breath.   Cardiovascular:  Negative for chest pain,  palpitations, claudication and leg swelling.  Gastrointestinal:  Negative for diarrhea, heartburn and nausea.  Genitourinary:  Negative for flank pain and frequency.  Musculoskeletal:  Negative for back pain and joint pain.  Skin:  Negative for itching.  Neurological:  Negative for dizziness, seizures and headaches.  Endo/Heme/Allergies:  Negative for environmental allergies.  Psychiatric/Behavioral:  Negative for memory loss. The patient does not have insomnia.        Objective:    BP 128/82   Pulse 96   Ht  (1.88 m)   Wt (!) 389 lb 1.3 oz (176.5 kg)   SpO2 96%   BMI 49.95 kg/m  BP Readings from Last 3 Encounters:  10/05/22 128/82  09/14/22 (!) 138/90  08/31/22 (!) 162/98   Wt Readings from Last 3 Encounters:  10/05/22 (!) 389 lb 1.3 oz (176.5 kg)  09/14/22 (!) 388 lb 1.3 oz (176 kg)  08/31/22 (!) 395 lb 1.3 oz (179.2 kg)      Physical Exam Skin:    Findings: Lesion present.     No results found for any visits on 10/05/22. Last CBC Lab Results  Component Value Date   WBC 10.7 (H) 08/04/2022   HGB 16.3 08/04/2022   HCT 48.0 08/04/2022   MCV 87.3 08/04/2022   MCH 28.3 08/04/2022   RDW 14.5 08/04/2022   PLT 240 08/04/2022   Last metabolic panel Lab Results  Component Value Date   GLUCOSE 100 (H) 08/31/2022   NA 141 08/31/2022   K 4.4 08/31/2022   CL 103 08/31/2022   CO2 24 08/31/2022   BUN 7 08/31/2022   CREATININE 0.95 08/31/2022   EGFR 100 08/31/2022   CALCIUM 9.1 08/31/2022   PROT 7.0 04/09/2022   ALBUMIN 4.2 04/09/2022   LABGLOB 2.8 04/09/2022   AGRATIO 1.5 04/09/2022   BILITOT 0.5 04/09/2022   ALKPHOS 107 04/09/2022   AST 29 04/09/2022   ALT 15 04/09/2022   ANIONGAP 9 08/04/2022   Last lipids Lab Results  Component Value Date   CHOL 137 04/09/2022   HDL 49 04/09/2022   LDLCALC 74 04/09/2022   TRIG 67 04/09/2022   CHOLHDL 2.8 04/09/2022   Last hemoglobin A1c Lab Results  Component Value Date   HGBA1C 5.9 (H) 04/09/2022    Last thyroid functions Lab Results  Component Value Date   TSH 0.896 04/09/2022   Last vitamin D Lab Results  Component Value Date   VD25OH 24.2 (L) 04/09/2022   Last vitamin B12 and Folate No results found for: "VITAMINB12", "FOLATE"  Assessment & Plan:    Routine Health Maintenance and Physical Exam  Immunization History  Administered Date(s) Administered   Tdap 05/15/2018    Health Maintenance  Topic Date Due   COVID-19 Vaccine (1) Never done   Hepatitis C Screening  Never done   COLONOSCOPY (Pts 45-21yrs Insurance coverage will need to be confirmed)  Never done   INFLUENZA VACCINE  01/21/2023   DTaP/Tdap/Td (2 - Td or Tdap) 05/15/2028   HIV Screening  Completed   HPV VACCINES  Aged Out    Discussed health benefits of physical activity, and encouraged him to engage in regular exercise appropriate for his age and condition.  Dermatitis -     Triamcinolone Acetonide; Apply 1 Application topically 2 (two) times daily.  Dispense: 28.4 g; Refill: 0    No follow-ups on file.     Gilmore Laroche, FNP

## 2022-10-06 DIAGNOSIS — L309 Dermatitis, unspecified: Secondary | ICD-10-CM | POA: Insufficient documentation

## 2022-10-06 DIAGNOSIS — Z0001 Encounter for general adult medical examination with abnormal findings: Secondary | ICD-10-CM | POA: Insufficient documentation

## 2022-10-06 NOTE — Assessment & Plan Note (Signed)
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

## 2022-10-06 NOTE — Assessment & Plan Note (Signed)
Physical exam as documented Discussed heart-healthy diet  Encouraged to Exercise: If you are able: 30 -60 minutes a day ,4 days a week, or 150 minutes a week. The longer the better. Combine stretch, strength, and aerobic activities Encourage to eat whole Food, Plant Predominant Nutrition is highly recommended: Eat Plenty of vegetables, Mushrooms, fruits, Legumes, Whole Grains, Nuts, seeds in lieu of processed meats, processed snacks/pastries red meat, poultry, eggs.  Will f/u in 1 year for CPE    

## 2022-10-07 ENCOUNTER — Telehealth: Payer: Self-pay | Admitting: Family Medicine

## 2022-10-07 NOTE — Telephone Encounter (Signed)
Pt calling to check to see if paper work is complete.

## 2022-10-07 NOTE — Telephone Encounter (Signed)
Spoke to pt let him know

## 2022-10-07 NOTE — Telephone Encounter (Signed)
Will have it completed and fax tomorrow

## 2022-10-08 ENCOUNTER — Ambulatory Visit: Payer: 59 | Admitting: Family Medicine

## 2022-12-21 ENCOUNTER — Ambulatory Visit: Payer: 59 | Admitting: Family Medicine

## 2023-02-16 ENCOUNTER — Encounter: Payer: Self-pay | Admitting: Family Medicine

## 2023-02-16 ENCOUNTER — Ambulatory Visit (INDEPENDENT_AMBULATORY_CARE_PROVIDER_SITE_OTHER): Payer: Medicaid Other | Admitting: Family Medicine

## 2023-02-16 VITALS — BP 141/72 | HR 77 | Ht 74.0 in | Wt 390.0 lb

## 2023-02-16 DIAGNOSIS — R7301 Impaired fasting glucose: Secondary | ICD-10-CM

## 2023-02-16 DIAGNOSIS — I10 Essential (primary) hypertension: Secondary | ICD-10-CM | POA: Diagnosis not present

## 2023-02-16 DIAGNOSIS — E559 Vitamin D deficiency, unspecified: Secondary | ICD-10-CM

## 2023-02-16 DIAGNOSIS — E7849 Other hyperlipidemia: Secondary | ICD-10-CM

## 2023-02-16 DIAGNOSIS — Z1211 Encounter for screening for malignant neoplasm of colon: Secondary | ICD-10-CM

## 2023-02-16 DIAGNOSIS — Z1159 Encounter for screening for other viral diseases: Secondary | ICD-10-CM

## 2023-02-16 DIAGNOSIS — E038 Other specified hypothyroidism: Secondary | ICD-10-CM

## 2023-02-16 MED ORDER — OLMESARTAN MEDOXOMIL-HCTZ 40-12.5 MG PO TABS: 1.0000 | ORAL_TABLET | Freq: Every day | ORAL | 1 refills | Status: DC

## 2023-02-16 NOTE — Progress Notes (Signed)
Established Patient Office Visit  Subjective:  Patient ID: Travis Beltran, male    DOB: [birth year]  Age: 47 y.o. MRN: [id number]  CC:  Chief Complaint  Patient presents with   Care Management    3 month f/u, reports he ran out of bp medication.     HPI Travis Beltran is a 47 y.o. male with past medical history of essential hypertension presents for f/u of  chronic medical conditions. For the details of today's visit, please refer to the assessment and plan.     Past Medical History:  Diagnosis Date   Heart murmur    Hypertension     Past Surgical History:  Procedure Laterality Date   LEFT HEART CATH AND CORONARY ANGIOGRAPHY N/A 07/02/2020   Procedure: LEFT HEART CATH AND CORONARY ANGIOGRAPHY;  Surgeon: [place]az[place], Peter M, MD;  Location: [facility] INVASIVE CV LAB;  Service: Cardiovascular;  Laterality: N/A;    Family History  Problem Relation Age of Onset   Cancer Father    Hypertension Father    Hypertension Mother     Social History   Socioeconomic History   Marital status: Married    Spouse name: Not on file   Number of children: Not on file   Years of education: Not on file   Highest education level: Not on file  Occupational History   Not on file  Tobacco Use   Smoking status: Every Day    Current packs/day: 0.50    Types: Cigarettes   Smokeless tobacco: Never  Vaping Use   Vaping status: Never Used  Substance and Sexual Activity   Alcohol use: Yes    Comment: occ   Drug use: No   Sexual activity: Not on file  Other Topics Concern   Not on file  Social History Narrative   Not on file   Social Determinants of Health   Financial Resource Strain: Not on file  Food Insecurity: Not on file  Transportation Needs: Not on file  Physical Activity: Not on file  Stress: Not on file  Social Connections: Not on file  Intimate Partner Violence: Not on file    Outpatient Medications Prior to Visit  Medication Sig Dispense Refill    olmesartan-hydrochlorothiazide (BENICAR HCT) 40-12.5 MG tablet Take 1 tablet by mouth daily. 90 tablet 1   triamcinolone cream (KENALOG) 0.1 % Apply 1 Application topically 2 (two) times daily. 28.4 g 0   No facility-administered medications prior to visit.    Allergies  Allergen Reactions   Amlodipine Besylate Rash    ROS Review of Systems  Constitutional:  Negative for fatigue and fever.  Eyes:  Negative for visual disturbance.  Respiratory:  Negative for chest tightness and shortness of breath.   Cardiovascular:  Negative for chest pain and palpitations.  Neurological:  Negative for dizziness and headaches.      Objective:    Physical Exam HENT:     Head: Normocephalic.     Right Ear: External ear normal.     Left Ear: External ear normal.     Nose: No congestion or rhinorrhea.     Mouth/Throat:     Mouth: Mucous membranes are moist.  Cardiovascular:     Rate and Rhythm: Regular rhythm.     Heart sounds: No murmur heard. Pulmonary:     Effort: No respiratory distress.     Breath sounds: Normal breath sounds.  Neurological:     Mental Status: He is alert.     BP Marland Kitchen)  141/72 (BP Location: Left Arm)   Pulse 77   Ht 6\' 2"  (1.88 m)   Wt (!) 390 lb 0.6 oz (176.9 kg)   SpO2 96%   BMI 50.08 kg/m  Wt Readings from Last 3 Encounters:  02/16/23 (!) 390 lb 0.6 oz (176.9 kg)  10/05/22 (!) 389 lb 1.3 oz (176.5 kg)  09/14/22 (!) 388 lb 1.3 oz (176 kg)    Lab Results  Component Value Date   TSH 0.896 04/09/2022   Lab Results  Component Value Date   WBC 10.7 (H) 08/04/2022   HGB 16.3 08/04/2022   HCT 48.0 08/04/2022   MCV 87.3 08/04/2022   PLT 240 08/04/2022   Lab Results  Component Value Date   NA 141 08/31/2022   K 4.4 08/31/2022   CO2 24 08/31/2022   GLUCOSE 100 (H) 08/31/2022   BUN 7 08/31/2022   CREATININE 0.95 08/31/2022   BILITOT 0.5 04/09/2022   ALKPHOS 107 04/09/2022   AST 29 04/09/2022   ALT 15 04/09/2022   PROT 7.0 04/09/2022   ALBUMIN 4.2  04/09/2022   CALCIUM 9.1 08/31/2022   ANIONGAP 9 08/04/2022   EGFR 100 08/31/2022   Lab Results  Component Value Date   CHOL 137 04/09/2022   Lab Results  Component Value Date   HDL 49 04/09/2022   Lab Results  Component Value Date   LDLCALC 74 04/09/2022   Lab Results  Component Value Date   TRIG 67 04/09/2022   Lab Results  Component Value Date   CHOLHDL 2.8 04/09/2022   Lab Results  Component Value Date   HGBA1C 5.9 (H) 04/09/2022      Assessment & Plan:  Essential hypertension Assessment & Plan: The patient's blood pressure remains uncontrolled but he is currently asymptomatic. He reports having been out of his blood pressure medication for the past 2 days, which is likely contributing to the elevated readings observed today in the clinic. He is encouraged to adhere to a low-sodium diet and to increase physical activity to help manage his blood pressure BP Readings from Last 3 Encounters:  02/16/23 (!) 141/72  10/05/22 128/82  09/14/22 (!) 138/90      Orders: -     Olmesartan Medoxomil-HCTZ; Take 1 tablet by mouth daily.  Dispense: 90 tablet; Refill: 1  Colon cancer screening -     Cologuard  IFG (impaired fasting glucose) -     Hemoglobin A1c  Vitamin D deficiency -     VITAMIN D 25 Hydroxy (Vit-D Deficiency, Fractures)  Need for hepatitis C screening test -     Hepatitis C antibody  Other specified hypothyroidism -     TSH + free T4  Other hyperlipidemia -     Lipid panel -     CMP14+EGFR -     CBC with Differential/Platelet  Note: This chart has been completed using Engineer, civil (consulting) software, and while attempts have been made to ensure accuracy, certain words and phrases may not be transcribed as intended.    Follow-up: Return in about 4 months (around 06/18/2023).   Gilmore Laroche, FNP

## 2023-02-16 NOTE — Assessment & Plan Note (Signed)
The patient's blood pressure remains uncontrolled but he is currently asymptomatic. He reports having been out of his blood pressure medication for the past 2 days, which is likely contributing to the elevated readings observed today in the clinic. He is encouraged to adhere to a low-sodium diet and to increase physical activity to help manage his blood pressure BP Readings from Last 3 Encounters:  02/16/23 (!) 141/72  10/05/22 128/82  09/14/22 (!) 138/90

## 2023-02-16 NOTE — Patient Instructions (Signed)
 I appreciate the opportunity to provide care to you today!    Follow up:  4 months  Labs: please stop by the lab during the week  to get your blood drawn (CBC, CMP, TSH, Lipid profile, HgA1c, Vit D)   Attached with your AVS, you will find valuable resources for self-education. I highly recommend dedicating some time to thoroughly examine them.   Please continue to a heart-healthy diet and increase your physical activities. Try to exercise for at least five days a week.    It was a pleasure to see you and I look forward to continuing to work together on your health and well-being. Please do not hesitate to call the office if you need care or have questions about your care.  In case of emergency, please visit the Emergency Department for urgent care, or contact our clinic at [phone] to schedule an appointment. We're here to help you!   Have a wonderful day and week. With Gratitude, Gilmore Laroche MSN, FNP-BC

## 2023-05-05 ENCOUNTER — Ambulatory Visit: Payer: Self-pay | Admitting: Family Medicine

## 2023-05-05 NOTE — Telephone Encounter (Signed)
Copied from CRM [id number]. Topic: Appointments - Appointment Cancel/Reschedule >> May 05, 2023  4:03 PM Larwance Sachs wrote: Patient/patient representative is calling to cancel or reschedule an appointment. Refer to attachments for appointment information.   Chief Complaint: bilateral leg pain and bilateral upperr thigh numbness Symptoms: pain and numbness Frequency: ongoing Pertinent Negatives: Patient denies weakness in less or falls Disposition: [] ED /[] Urgent Care (no appt availability in office) / [x] Appointment(In office/virtual)/ []  [facility]/ [] Home Care/ [] Refused Recommended Disposition /[] [facility]/ []  Follow-up with PCP Additional Notes: pt c/o bilateral leg pain and bilateral upper thigh numbness; stated needed to be see earlier than current appt. Offered pt appt tomorrow with different provider: t refused to see any other provider only wants PCP: [facility] available appt w/PCP scheduled for 05/25/23:pt accepted appt and home care advice given.  Pt currently not taking anything for pain.   Reason for Disposition  [1] MODERATE pain (e.g., interferes with normal activities, limping) AND [2] present > 3 days  Answer Assessment - Initial Assessment Questions 1. ONSET: "When did the pain start?"      X 2 month 2. LOCATION: "Where is the pain located?"  Bilateral upper and lower pain and numbness in bilateral thighs 3. PAIN: "How bad is the pain?"    (Scale 1-10; or mild, moderate, severe)   -  MILD (1-3): doesn't interfere with normal activities    -  MODERATE (4-7): interferes with normal activities (e.g., work or school) or awakens from sleep, limping    -  SEVERE (8-10): excruciating pain, unable to do any normal activities, unable to walk     7/10 4. WORK OR EXERCISE: "Has there been any recent work or exercise that involved this part of the body?"      No  5. CAUSE: "What do you think is causing the leg pain?"     Thinks its arthritis 6. OTHER SYMPTOMS: "Do  you have any other symptoms?" (e.g., chest pain, back pain, breathing difficulty, swelling, rash, fever, numbness, weakness)     Numbness bilateral thighs 7. PREGNANCY: "Is there any chance you are pregnant?" "When was your last menstrual period?"     no  Protocols used: Leg Pain-A-AH

## 2023-05-25 ENCOUNTER — Ambulatory Visit: Payer: Self-pay | Admitting: Family Medicine

## 2023-06-02 ENCOUNTER — Encounter: Payer: Self-pay | Admitting: Family Medicine

## 2023-06-14 ENCOUNTER — Ambulatory Visit: Payer: Medicaid Other | Admitting: Family Medicine

## 2023-06-22 ENCOUNTER — Ambulatory Visit: Payer: Self-pay | Admitting: Family Medicine

## 2023-06-29 ENCOUNTER — Ambulatory Visit (INDEPENDENT_AMBULATORY_CARE_PROVIDER_SITE_OTHER): Payer: Medicaid Other | Admitting: Family Medicine

## 2023-06-29 ENCOUNTER — Encounter: Payer: Self-pay | Admitting: Family Medicine

## 2023-06-29 VITALS — BP 138/86 | HR 75 | Ht 74.0 in | Wt 382.1 lb

## 2023-06-29 DIAGNOSIS — G629 Polyneuropathy, unspecified: Secondary | ICD-10-CM | POA: Diagnosis not present

## 2023-06-29 DIAGNOSIS — I10 Essential (primary) hypertension: Secondary | ICD-10-CM

## 2023-06-29 MED ORDER — VITAMIN B-6 50 MG PO TABS: 50.0000 mg | ORAL_TABLET | Freq: Every day | ORAL | 2 refills | Status: DC

## 2023-06-29 NOTE — Patient Instructions (Addendum)
 I appreciate the opportunity to provide care to you today!    Follow up:  4 months  Labs: please stop by the lab during the week to get your blood drawn (CBC, CMP, TSH, Lipid profile, HgA1c, Vit D)  -Start taking pyridoxine 50 mg daily for the burning sensation in your legs  A burning sensation in the thigh during prolonged standing can be caused by several factors, including:  Nerve Compression or Irritation: Conditions like meralgia paresthetica, where the lateral femoral cutaneous nerve is compressed, can cause a burning sensation on the outer thigh. This may occur due to prolonged standing, tight clothing, or pressure on the nerve.  Sciatica or Lumbar Spine Issues: If there's irritation or compression of the nerve roots in the lower back (as with a herniated disc or spinal stenosis), it may radiate down to the thigh, causing a burning sensation.  Muscle Fatigue or Overuse: Prolonged standing may cause strain on the muscles and soft tissues of the thigh, leading to discomfort and a burning feeling. This is often seen in individuals with poor posture or lack of muscle strength.  Poor Circulation: Standing for long periods can also affect blood flow to the legs and thighs, leading to sensations like burning or tingling.  Recommendations: Change Positions Regularly: Avoid prolonged standing. Alternate between sitting, standing, or walking to improve circulation. Compression Stockings: These can help improve circulation and reduce swelling or discomfort. Stretching and Strengthening Exercises: Stretching the hip flexors and thigh muscles can alleviate strain, and strengthening your core and legs can support proper posture and reduce pressure on nerves. Nerve Pain Relief: If nerve compression is suspected, over-the-counter pain relievers like ibuprofen  or acetaminophen  may help.   Please continue to a heart-healthy diet and increase your physical activities. Try to exercise for at  least five days a week.    It was a pleasure to see you and I look forward to continuing to work together on your health and well-being. Please do not hesitate to call the office if you need care or have questions about your care.  In case of emergency, please visit the Emergency Department for urgent care, or contact our clinic at [phone] to schedule an appointment. We're here to help you!   Have a wonderful day and week. With Gratitude, Janos Shampine MSN, FNP-BC

## 2023-06-29 NOTE — Progress Notes (Signed)
 Established Patient Office Visit  Subjective:  Patient ID: Travis Beltran, male    DOB: March 23, 1976  Age: 48 y.o. MRN: [id number]  CC:  Chief Complaint  Patient presents with   Leg Pain    Pt reports leg pain. Pt reports ongoing for about 1 month, has burning sensation, and weakens at times. Reports a faint from a bad cough around christmas 06/18/2023    HPI Travis Beltran is a 48 y.o. male with past medical history of Hypertension, obesity presents for f/u of  chronic medical conditions. For the details of today's visit, please refer to the assessment and plan.  Past Medical History:  Diagnosis Date   Heart murmur    Hypertension     Past Surgical History:  Procedure Laterality Date   LEFT HEART CATH AND CORONARY ANGIOGRAPHY N/A 07/02/2020   Procedure: LEFT HEART CATH AND CORONARY ANGIOGRAPHY;  Surgeon: Jordan, Peter M, MD;  Location: [facility] INVASIVE CV LAB;  Service: Cardiovascular;  Laterality: N/A;    Family History  Problem Relation Age of Onset   Cancer Father    Hypertension Father    Hypertension Mother     Social History   Socioeconomic History   Marital status: Married    Spouse name: Not on file   Number of children: Not on file   Years of education: Not on file   Highest education level: Not on file  Occupational History   Not on file  Tobacco Use   Smoking status: Every Day    Current packs/day: 0.50    Types: Cigarettes   Smokeless tobacco: Never  Vaping Use   Vaping status: Never Used  Substance and Sexual Activity   Alcohol use: Yes    Comment: occ   Drug use: No   Sexual activity: Not on file  Other Topics Concern   Not on file  Social History Narrative   Not on file   Social Drivers of Health   Financial Resource Strain: Not on file  Food Insecurity: Not on file  Transportation Needs: Not on file  Physical Activity: Not on file  Stress: Not on file  Social Connections: Not on file  Intimate Partner Violence: Not on file     Outpatient Medications Prior to Visit  Medication Sig Dispense Refill   olmesartan -hydrochlorothiazide (BENICAR  HCT) 40-12.5 MG tablet Take 1 tablet by mouth daily. 90 tablet 1   No facility-administered medications prior to visit.    Allergies  Allergen Reactions   Amlodipine  Besylate Rash    ROS Review of Systems  Constitutional:  Negative for fatigue and fever.  Eyes:  Negative for visual disturbance.  Respiratory:  Negative for chest tightness and shortness of breath.   Cardiovascular:  Negative for chest pain and palpitations.  Neurological:  Negative for dizziness and headaches.      Objective:    Physical Exam HENT:     Head: Normocephalic.     Right Ear: External ear normal.     Left Ear: External ear normal.     Nose: No congestion or rhinorrhea.     Mouth/Throat:     Mouth: Mucous membranes are moist.  Cardiovascular:     Rate and Rhythm: Regular rhythm.     Heart sounds: No murmur heard. Pulmonary:     Effort: No respiratory distress.     Breath sounds: Normal breath sounds.  Neurological:     Mental Status: He is alert.     BP 138/86 (BP Location: Left  Arm)   Pulse 75   Ht 6' 2 (1.88 m)   Wt (!) 382 lb 1.3 oz (173.3 kg)   SpO2 97%   BMI 49.06 kg/m  Wt Readings from Last 3 Encounters:  06/29/23 (!) 382 lb 1.3 oz (173.3 kg)  02/16/23 (!) 390 lb 0.6 oz (176.9 kg)  10/05/22 (!) 389 lb 1.3 oz (176.5 kg)    Lab Results  Component Value Date   TSH 0.896 04/09/2022   Lab Results  Component Value Date   WBC 10.7 (H) 08/04/2022   HGB 16.3 08/04/2022   HCT 48.0 08/04/2022   MCV 87.3 08/04/2022   PLT 240 08/04/2022   Lab Results  Component Value Date   NA 141 08/31/2022   K 4.4 08/31/2022   CO2 24 08/31/2022   GLUCOSE 100 (H) 08/31/2022   BUN 7 08/31/2022   CREATININE 0.95 08/31/2022   BILITOT 0.5 04/09/2022   ALKPHOS 107 04/09/2022   AST 29 04/09/2022   ALT 15 04/09/2022   PROT 7.0 04/09/2022   ALBUMIN 4.2 04/09/2022   CALCIUM   9.1 08/31/2022   ANIONGAP 9 08/04/2022   EGFR 100 08/31/2022   Lab Results  Component Value Date   CHOL 137 04/09/2022   Lab Results  Component Value Date   HDL 49 04/09/2022   Lab Results  Component Value Date   LDLCALC 74 04/09/2022   Lab Results  Component Value Date   TRIG 67 04/09/2022   Lab Results  Component Value Date   CHOLHDL 2.8 04/09/2022   Lab Results  Component Value Date   HGBA1C 5.9 (H) 04/09/2022      Assessment & Plan:  Neuropathy Assessment & Plan: The patient complains of an intermittent burning sensation in the vastus lateralis. The pain is non-radiating, occurs with prolonged standing, and is relieved with rest. No recent injury or trauma was reported. Therapy with pyridoxine 50 mg daily will be initiated. The patient verbalized understanding and is aware of the plan of care.    Orders: -     Vitamin B-6; Take 1 tablet (50 mg total) by mouth daily.  Dispense: 30 tablet; Refill: 2  Essential hypertension Assessment & Plan: Controlled Blood Pressure The patient reports compliance with his current regimen and is asymptomatic. He was encouraged to continue taking Olmesartan -Hydrochlorothiazide 40-12.5 mg daily. A low-sodium diet of less than 2300 mg daily is recommended, along with increased physical activity of moderate intensity, aiming for at least 150 minutes per week. The patient was encouraged to maintain these lifestyle modifications to effectively manage his blood pressure.  BP Readings from Last 3 Encounters:  06/29/23 138/86  02/16/23 (!) 141/72  10/05/22 128/82       Note: This chart has been completed using Dragon Medical Dictation software, and while attempts have been made to ensure accuracy, certain words and phrases may not be transcribed as intended.    Follow-up: Return in about 4 months (around 10/27/2023).   Travis Tremblay, FNP

## 2023-07-01 DIAGNOSIS — G629 Polyneuropathy, unspecified: Secondary | ICD-10-CM | POA: Insufficient documentation

## 2023-07-01 NOTE — Assessment & Plan Note (Signed)
 The patient complains of an intermittent burning sensation in the vastus lateralis. The pain is non-radiating, occurs with prolonged standing, and is relieved with rest. No recent injury or trauma was reported. Therapy with pyridoxine 50 mg daily will be initiated. The patient verbalized understanding and is aware of the plan of care.

## 2023-07-01 NOTE — Assessment & Plan Note (Signed)
 Controlled Blood Pressure The patient reports compliance with his current regimen and is asymptomatic. He was encouraged to continue taking Olmesartan -Hydrochlorothiazide 40-12.5 mg daily. A low-sodium diet of less than 2300 mg daily is recommended, along with increased physical activity of moderate intensity, aiming for at least 150 minutes per week. The patient was encouraged to maintain these lifestyle modifications to effectively manage his blood pressure.  BP Readings from Last 3 Encounters:  06/29/23 138/86  02/16/23 (!) 141/72  10/05/22 128/82

## 2023-10-26 ENCOUNTER — Ambulatory Visit: Payer: Medicaid Other | Admitting: Family Medicine

## 2024-01-06 ENCOUNTER — Ambulatory Visit: Admitting: Family Medicine

## 2024-01-07 ENCOUNTER — Ambulatory Visit (INDEPENDENT_AMBULATORY_CARE_PROVIDER_SITE_OTHER): Admitting: Family Medicine

## 2024-01-07 ENCOUNTER — Encounter: Payer: Self-pay | Admitting: Family Medicine

## 2024-01-07 VITALS — BP 160/100 | HR 73 | Resp 16 | Ht 74.0 in | Wt 393.0 lb

## 2024-01-07 DIAGNOSIS — I10 Essential (primary) hypertension: Secondary | ICD-10-CM

## 2024-01-07 DIAGNOSIS — K429 Umbilical hernia without obstruction or gangrene: Secondary | ICD-10-CM

## 2024-01-07 MED ORDER — OLMESARTAN MEDOXOMIL-HCTZ 40-12.5 MG PO TABS: 1.0000 | ORAL_TABLET | Freq: Every day | ORAL | 1 refills | Status: DC

## 2024-01-07 NOTE — Patient Instructions (Addendum)
 I appreciate the opportunity to provide care to you today!    Follow up:  1 months Nurse follow up for BP   Please take your blood pressure medication every day as prescribed, and adhere to a low-sodium diet and increase your physical activity for better control of your blood pressure.  Please report to the emergency department if your blood pressure exceeds 180/120 and is accompanied by symptoms such as headaches, chest pain, palpitations, blurred vision, or dizziness.   Please follow up if your symptoms worsen or fail to improve.     Attached with your AVS, you will find valuable resources for self-education. I highly recommend dedicating some time to thoroughly examine them.   Please continue to a heart-healthy diet and increase your physical activities. Try to exercise for at least five days a week.    It was a pleasure to see you and I look forward to continuing to work together on your health and well-being. Please do not hesitate to call the office if you need care or have questions about your care.  In case of emergency, please visit the Emergency Department for urgent care, or contact our clinic at [phone] to schedule an appointment. We're here to help you!   Have a wonderful day and week. With Gratitude, Linzy Laury MSN, FNP-BC

## 2024-01-07 NOTE — Progress Notes (Signed)
 Established Patient Office Visit  Subjective:  Patient ID: Travis Beltran, male    DOB: [birth year]  Age: 48 y.o. MRN: [id number]  CC:  Chief Complaint  Patient presents with   Hypertension    HPI Travis Beltran is a 48 y.o. male with past medical history of  Hypertension presents for f/u of  chronic medical conditions.  For the details of today's visit, please refer to the assessment and plan.      Past Medical History:  Diagnosis Date   Heart murmur    Hypertension     Past Surgical History:  Procedure Laterality Date   LEFT HEART CATH AND CORONARY ANGIOGRAPHY N/A 07/02/2020   Procedure: LEFT HEART CATH AND CORONARY ANGIOGRAPHY;  Surgeon: [place], Peter M, MD;  Location: [facility] INVASIVE CV LAB;  Service: Cardiovascular;  Laterality: N/A;    Family History  Problem Relation Age of Onset   Cancer Father    Hypertension Father    Hypertension Mother     Social History   Socioeconomic History   Marital status: Married    Spouse name: Not on file   Number of children: Not on file   Years of education: Not on file   Highest education level: Not on file  Occupational History   Not on file  Tobacco Use   Smoking status: Every Day    Current packs/day: 0.50    Types: Cigarettes   Smokeless tobacco: Never  Vaping Use   Vaping status: Never Used  Substance and Sexual Activity   Alcohol use: Yes    Comment: occ   Drug use: No   Sexual activity: Not on file  Other Topics Concern   Not on file  Social History Narrative   Not on file   Social Drivers of Health   Financial Resource Strain: Not on file  Food Insecurity: Not on file  Transportation Needs: Not on file  Physical Activity: Not on file  Stress: Not on file  Social Connections: Not on file  Intimate Partner Violence: Not on file    Outpatient Medications Prior to Visit  Medication Sig Dispense Refill   olmesartan -hydrochlorothiazide (BENICAR  HCT) 40-12.5 MG tablet Take 1 tablet by mouth  daily. 90 tablet 1   pyridOXINE (VITAMIN B6) 50 MG tablet Take 1 tablet (50 mg total) by mouth daily. (Patient not taking: Reported on 01/07/2024) 30 tablet 2   No facility-administered medications prior to visit.    Allergies  Allergen Reactions   Amlodipine  Besylate Rash    ROS Review of Systems  Constitutional:  Negative for fatigue and fever.  Eyes:  Negative for visual disturbance.  Respiratory:  Negative for chest tightness and shortness of breath.   Cardiovascular:  Negative for chest pain and palpitations.  Neurological:  Negative for dizziness and headaches.      Objective:    Physical Exam HENT:     Head: Normocephalic.     Right Ear: External ear normal.     Left Ear: External ear normal.     Nose: No congestion or rhinorrhea.     Mouth/Throat:     Mouth: Mucous membranes are moist.  Cardiovascular:     Rate and Rhythm: Regular rhythm.     Heart sounds: No murmur heard. Pulmonary:     Effort: No respiratory distress.     Breath sounds: Normal breath sounds.  Abdominal:     Hernia: A hernia is present.  Neurological:     Mental Status: He is  alert.     BP (!) 160/100   Pulse 73   Resp 16   Ht 6' 2 (1.88 m)   Wt (!) 393 lb (178.3 kg)   SpO2 90%   BMI 50.46 kg/m  Wt Readings from Last 3 Encounters:  01/07/24 (!) 393 lb (178.3 kg)  06/29/23 (!) 382 lb 1.3 oz (173.3 kg)  02/16/23 (!) 390 lb 0.6 oz (176.9 kg)    Lab Results  Component Value Date   TSH 1.210 01/07/2024   Lab Results  Component Value Date   WBC 8.6 01/07/2024   HGB 15.6 01/07/2024   HCT 49.1 01/07/2024   MCV 89 01/07/2024   PLT 241 01/07/2024   Lab Results  Component Value Date   NA 142 01/07/2024   K 4.3 01/07/2024   CO2 22 01/07/2024   GLUCOSE 91 01/07/2024   BUN 9 01/07/2024   CREATININE 0.93 01/07/2024   BILITOT 0.6 01/07/2024   ALKPHOS 105 01/07/2024   AST 17 01/07/2024   ALT 11 01/07/2024   PROT 6.3 01/07/2024   ALBUMIN 4.0 (L) 01/07/2024   CALCIUM  9.1  01/07/2024   ANIONGAP 9 08/04/2022   EGFR 102 01/07/2024   Lab Results  Component Value Date   CHOL 146 01/07/2024   Lab Results  Component Value Date   HDL 46 01/07/2024   Lab Results  Component Value Date   LDLCALC 86 01/07/2024   Lab Results  Component Value Date   TRIG 69 01/07/2024   Lab Results  Component Value Date   CHOLHDL 3.2 01/07/2024   Lab Results  Component Value Date   HGBA1C 5.6 01/07/2024      Assessment & Plan:  Essential hypertension Assessment & Plan: Uncontrolled hypertension The patient is currently asymptomatic. He reports non-adherence to his blood pressure medication, stating he skips doses or forgets to take it. Currently prescribed Olmesartan -Hydrochlorothiazide 40/12.5 mg daily. Discussed the cardiovascular risks of uncontrolled hypertension, including heart attack, stroke, kidney damage, and heart failure. Emphasized the importance of daily medication adherence and compliance. No changes made to the current medication regimen at this time. Encouraged lifestyle modifications, including a low-sodium diet and increased physical activity. Patient to monitor blood pressure at home and follow up as scheduled   Orders: -     Olmesartan  Medoxomil-HCTZ; Take 1 tablet by mouth daily.  Dispense: 90 tablet; Refill: 1  Umbilical hernia without obstruction and without gangrene Assessment & Plan: The patient reports having an abdominal hernia for approximately one year. He states it only causes discomfort when he leans against something; otherwise, it is not bothersome. 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 Educated patient on activity modification to reduce strain on the hernia. Recommended avoiding heavy lifting and prolonged bending when possible. Discussed potential surgical options if the hernia becomes painful or increases in size. Advised to monitor for signs of  hernia complications (e.g., increased pain, redness, bulging, or gastrointestinal symptoms). Encouraged to follow up if symptoms worsen.    Note: This chart has been completed using Engineer, civil (consulting) software, and while attempts have been made to ensure accuracy, certain words and phrases may not be transcribed as intended.    Follow-up: Return in about 1 month (around 02/07/2024).   Jacquiline Zurcher, FNP

## 2024-01-08 LAB — HEPATITIS C ANTIBODY: Hep C Virus Ab: NONREACTIVE

## 2024-01-08 LAB — CMP14+EGFR
ALT: 11 IU/L (ref 0–44)
AST: 17 IU/L (ref 0–40)
Albumin: 4 g/dL — ABNORMAL LOW (ref 4.1–5.1)
Alkaline Phosphatase: 105 IU/L (ref 44–121)
BUN/Creatinine Ratio: 10 (ref 9–20)
BUN: 9 mg/dL (ref 6–24)
Bilirubin Total: 0.6 mg/dL (ref 0.0–1.2)
CO2: 22 mmol/L (ref 20–29)
Calcium: 9.1 mg/dL (ref 8.7–10.2)
Chloride: 103 mmol/L (ref 96–106)
Creatinine, Ser: 0.93 mg/dL (ref 0.76–1.27)
Globulin, Total: 2.3 g/dL (ref 1.5–4.5)
Glucose: 91 mg/dL (ref 70–99)
Potassium: 4.3 mmol/L (ref 3.5–5.2)
Sodium: 142 mmol/L (ref 134–144)
Total Protein: 6.3 g/dL (ref 6.0–8.5)
eGFR: 102 mL/min/1.73 (ref >59)

## 2024-01-08 LAB — HEMOGLOBIN A1C
Est. average glucose Bld gHb Est-mCnc: 114 mg/dL
Hgb A1c MFr Bld: 5.6 % (ref 4.8–5.6)

## 2024-01-08 LAB — CBC WITH DIFFERENTIAL/PLATELET
Basophils Absolute: 0.1 x10E3/uL (ref 0.0–0.2)
Basos: 1 %
EOS (ABSOLUTE): 0.4 x10E3/uL (ref 0.0–0.4)
Eos: 5 %
Hematocrit: 49.1 % (ref 37.5–51.0)
Hemoglobin: 15.6 g/dL (ref 13.0–17.7)
Immature Grans (Abs): 0 x10E3/uL (ref 0.0–0.1)
Immature Granulocytes: 0 %
Lymphocytes Absolute: 1.9 x10E3/uL (ref 0.7–3.1)
Lymphs: 22 %
MCH: 28.4 pg (ref 26.6–33.0)
MCHC: 31.8 g/dL (ref 31.5–35.7)
MCV: 89 fL (ref 79–97)
Monocytes Absolute: 0.5 x10E3/uL (ref 0.1–0.9)
Monocytes: 6 %
Neutrophils Absolute: 5.8 x10E3/uL (ref 1.4–7.0)
Neutrophils: 66 %
Platelets: 241 x10E3/uL (ref 150–450)
RBC: 5.5 x10E6/uL (ref 4.14–5.80)
RDW: 13.8 % (ref 11.6–15.4)
WBC: 8.6 x10E3/uL (ref 3.4–10.8)

## 2024-01-08 LAB — LIPID PANEL
Chol/HDL Ratio: 3.2 ratio (ref 0.0–5.0)
Cholesterol, Total: 146 mg/dL (ref 100–199)
HDL: 46 mg/dL (ref >39)
LDL Chol Calc (NIH): 86 mg/dL (ref 0–99)
Triglycerides: 69 mg/dL (ref 0–149)
VLDL Cholesterol Cal: 14 mg/dL (ref 5–40)

## 2024-01-08 LAB — TSH+FREE T4
Free T4: 1.22 ng/dL (ref 0.82–1.77)
TSH: 1.21 u[IU]/mL (ref 0.450–4.500)

## 2024-01-08 LAB — VITAMIN D 25 HYDROXY (VIT D DEFICIENCY, FRACTURES): Vit D, 25-Hydroxy: 24.7 ng/mL — ABNORMAL LOW (ref 30.0–100.0)

## 2024-01-09 ENCOUNTER — Ambulatory Visit: Payer: Self-pay | Admitting: Family Medicine

## 2024-01-09 DIAGNOSIS — K469 Unspecified abdominal hernia without obstruction or gangrene: Secondary | ICD-10-CM | POA: Insufficient documentation

## 2024-01-09 NOTE — Assessment & Plan Note (Signed)
 The patient reports having an abdominal hernia for approximately one year. He states it only causes discomfort when he leans against something; otherwise, it is not bothersome. 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 Educated patient on activity modification to reduce strain on the hernia. Recommended avoiding heavy lifting and prolonged bending when possible. Discussed potential surgical options if the hernia becomes painful or increases in size. Advised to monitor for signs of hernia complications (e.g., increased pain, redness, bulging, or gastrointestinal symptoms). Encouraged to follow up if symptoms worsen.

## 2024-01-09 NOTE — Assessment & Plan Note (Signed)
 Uncontrolled hypertension The patient is currently asymptomatic. He reports non-adherence to his blood pressure medication, stating he skips doses or forgets to take it. Currently prescribed Olmesartan -Hydrochlorothiazide 40/12.5 mg daily. Discussed the cardiovascular risks of uncontrolled hypertension, including heart attack, stroke, kidney damage, and heart failure. Emphasized the importance of daily medication adherence and compliance. No changes made to the current medication regimen at this time. Encouraged lifestyle modifications, including a low-sodium diet and increased physical activity. Patient to monitor blood pressure at home and follow up as scheduled

## 2024-02-07 ENCOUNTER — Ambulatory Visit

## 2024-03-16 ENCOUNTER — Other Ambulatory Visit: Payer: Self-pay | Admitting: Family Medicine

## 2024-03-16 DIAGNOSIS — I10 Essential (primary) hypertension: Secondary | ICD-10-CM

## 2024-03-16 MED ORDER — OLMESARTAN MEDOXOMIL-HCTZ 40-12.5 MG PO TABS: 1.0000 | ORAL_TABLET | Freq: Every day | ORAL | 1 refills | Status: DC

## 2024-03-16 NOTE — Telephone Encounter (Signed)
 Copied from CRM [id number]. Topic: Clinical - Medication Refill >> Mar 16, 2024 12:42 PM Santiya F wrote: Medication: olmesartan -hydrochlorothiazide (BENICAR  HCT) 40-12.5 MG tablet [[id number]]  Has the patient contacted their pharmacy? Yes  (Agent: If yes, when and what did the pharmacy advise?) contact office   This is the patient's preferred pharmacy: [facility], [address] [place], [place] [address] Is this the correct pharmacy for this prescription? Yes   Has the prescription been filled recently? Yes  Is the patient out of the medication? No, one pill left   Has the patient been seen for an appointment in the last year OR does the patient have an upcoming appointment? Yes  Can we respond through MyChart? No  Agent: Please be advised that Rx refills may take up to 3 business days. We ask that you follow-up with your pharmacy.

## 2024-03-31 ENCOUNTER — Emergency Department (HOSPITAL_COMMUNITY): Admission: EM | Admit: 2024-03-31 | Discharge: 2024-03-31 | Disposition: A | Discharge status: Home / Self Care

## 2024-03-31 ENCOUNTER — Encounter (HOSPITAL_COMMUNITY): Payer: Self-pay

## 2024-03-31 ENCOUNTER — Other Ambulatory Visit: Payer: Self-pay

## 2024-03-31 ENCOUNTER — Emergency Department (HOSPITAL_COMMUNITY)

## 2024-03-31 DIAGNOSIS — I10 Essential (primary) hypertension: Secondary | ICD-10-CM | POA: Insufficient documentation

## 2024-03-31 DIAGNOSIS — R0981 Nasal congestion: Secondary | ICD-10-CM | POA: Insufficient documentation

## 2024-03-31 DIAGNOSIS — I2510 Atherosclerotic heart disease of native coronary artery without angina pectoris: Secondary | ICD-10-CM | POA: Insufficient documentation

## 2024-03-31 DIAGNOSIS — Z79899 Other long term (current) drug therapy: Secondary | ICD-10-CM | POA: Insufficient documentation

## 2024-03-31 DIAGNOSIS — R42 Dizziness and giddiness: Secondary | ICD-10-CM | POA: Diagnosis present

## 2024-03-31 DIAGNOSIS — R519 Headache, unspecified: Secondary | ICD-10-CM | POA: Insufficient documentation

## 2024-03-31 LAB — CBC WITH DIFFERENTIAL/PLATELET
Abs Immature Granulocytes: 0.03 K/uL (ref 0.00–0.07)
Basophils Absolute: 0.1 K/uL (ref 0.0–0.1)
Basophils Relative: 1 %
Eosinophils Absolute: 0.3 K/uL (ref 0.0–0.5)
Eosinophils Relative: 3 %
HCT: 49.2 % (ref 39.0–52.0)
Hemoglobin: 16 g/dL (ref 13.0–17.0)
Immature Granulocytes: 0 %
Lymphocytes Relative: 20 %
Lymphs Abs: 1.8 K/uL (ref 0.7–4.0)
MCH: 29 pg (ref 26.0–34.0)
MCHC: 32.5 g/dL (ref 30.0–36.0)
MCV: 89.3 fL (ref 80.0–100.0)
Monocytes Absolute: 0.6 K/uL (ref 0.1–1.0)
Monocytes Relative: 7 %
Neutro Abs: 6 K/uL (ref 1.7–7.7)
Neutrophils Relative %: 69 %
Platelets: 256 K/uL (ref 150–400)
RBC: 5.51 MIL/uL (ref 4.22–5.81)
RDW: 14 % (ref 11.5–15.5)
WBC: 8.7 K/uL (ref 4.0–10.5)
nRBC: 0 % (ref 0.0–0.2)

## 2024-03-31 LAB — BASIC METABOLIC PANEL WITH GFR
Anion gap: 11 (ref 5–15)
BUN: 8 mg/dL (ref 6–20)
CO2: 28 mmol/L (ref 22–32)
Calcium: 9.2 mg/dL (ref 8.9–10.3)
Chloride: 103 mmol/L (ref 98–111)
Creatinine, Ser: 0.85 mg/dL (ref 0.61–1.24)
GFR, Estimated: >60 mL/min (ref >60)
Glucose, Bld: 87 mg/dL (ref 70–99)
Potassium: 4 mmol/L (ref 3.5–5.1)
Sodium: 142 mmol/L (ref 135–145)

## 2024-03-31 LAB — RESP PANEL BY RT-PCR (RSV, FLU A&B, COVID)  RVPGX2
Influenza A by PCR: NEGATIVE
Influenza B by PCR: NEGATIVE
Resp Syncytial Virus by PCR: NEGATIVE
SARS Coronavirus 2 by RT PCR: NEGATIVE

## 2024-03-31 NOTE — ED Provider Notes (Signed)
 [place] EMERGENCY DEPARTMENT AT [facility] Provider Note   CSN: [id number] Arrival date & time: 03/31/24  1013     Patient presents with: Nasal Congestion and Dizziness   Travis Beltran is a 48 y.o. male.  History of CAD, PVD, hypertension presents ER today complaining of dizziness described as lightheadedness and swimmy headedness.  He states he woke up this morning around 930, went to get coffee and had some nasal congestion and mild headache.  He states he had sudden onset of dizziness described as spinning that lasted only for a few seconds and then he has felt persistently lightheaded and like he is drunk.  He denies any difficulty with ambulation, denies slurred speech, denies numbness ting or weakness.  No chest pain or shortness of breath.  No syncope.  His symptoms do not change with position.    Dizziness      Prior to Admission medications   Medication Sig Start Date End Date Taking? Authorizing Provider  olmesartan -hydrochlorothiazide (BENICAR  HCT) 40-12.5 MG tablet Take 1 tablet by mouth daily. 03/16/24 09/12/24  BacchusMeade PEDLAR, FNP  pyridOXINE (VITAMIN B6) 50 MG tablet Take 1 tablet (50 mg total) by mouth daily. Patient not taking: Reported on 01/07/2024 06/29/23   Edman Meade PEDLAR, FNP  predniSONE  (STERAPRED UNI-PAK 21 TAB) 10 MG (21) TBPK tablet 10mg  Tabs, 6 day taper. Use as directed Patient not taking: Reported on 11/03/2021 12/14/20   Roselyn Carlin NOVAK, MD    Allergies: Amlodipine  besylate    Review of Systems  Neurological:  Positive for dizziness.    Updated Vital Signs BP (!) 187/114   Pulse 72   Temp 98.2 F (36.8 C) (Oral)   Resp 18   Ht 6' 2 (1.88 m)   Wt (!) 184.2 kg   SpO2 94%   BMI 52.13 kg/m   Physical Exam Vitals and nursing note reviewed.  Constitutional:      General: He is not in acute distress.    Appearance: He is well-developed.  HENT:     Head: Normocephalic and atraumatic.  Eyes:     Conjunctiva/sclera:  Conjunctivae normal.  Cardiovascular:     Rate and Rhythm: Normal rate and regular rhythm.     Heart sounds: No murmur heard. Pulmonary:     Effort: Pulmonary effort is normal. No respiratory distress.     Breath sounds: Normal breath sounds.  Abdominal:     Palpations: Abdomen is soft.     Tenderness: There is no abdominal tenderness.  Musculoskeletal:        General: No swelling.     Cervical back: Neck supple.  Skin:    General: Skin is warm and dry.     Capillary Refill: Capillary refill takes less than 2 seconds.  Neurological:     General: No focal deficit present.     Mental Status: He is alert and oriented to person, place, and time.  Psychiatric:        Mood and Affect: Mood normal.     (all labs ordered are listed, but only abnormal results are displayed) Labs Reviewed  RESP PANEL BY RT-PCR (RSV, FLU A&B, COVID)  [id number]    EKG: None  Radiology: No results found.   Procedures   Medications Ordered in the ED - No data to display  Medical Decision Making This patient presents to the ED for concern of dizziness and sinus congestion, this involves an extensive number of treatment options, and is a complaint that carries with it a high risk of complications and morbidity.  The differential diagnosis includes viral illness, orthostatic hypotension, central vertigo, peripheral vertigo, intracranial hemorrhage, sinusitis,   Co morbidities that complicate the patient evaluation  Hypertension   Additional history obtained:  Additional history obtained from EMR External records from outside source obtained and reviewed including prior notes, labs   Lab Tests:  I Ordered, and personally interpreted labs.  The pertinent results include: Negative viral respiratory panel, CBC and BMP normal   Imaging Studies ordered:  I ordered imaging studies including CT head I independently visualized and interpreted imaging which showed  no intracranial hemorrhage or acute infarct I agree with the radiologist interpretation     Problem List / ED Course / Critical interventions / Medication management  Dizziness described as lightheadedness-patient had a couple of seconds of vertigo and has just been lightheaded, had normal orthostatics here, he is actually quite hypertensive but he states his blood pressure is even higher than this normally.  He is now feeling much better, he had a normal neurologic exam, his headache has resolved and a normal CT of his brain.  Discussed with patient his symptoms are nonspecific though he is reassuring workup and do not feel he needs any further testing at this time.  He instructed on close follow-up with his PCP for recheck of his blood pressure.  He was informed pressures very high today, he states this is normal for him, we discussed risks of longstanding hypertension including stroke, heart attack, kidney failure and need for better control chronically.  Patient was seen by Dr. Simon. I have reviewed the patients home medicines and have made adjustments as needed     Amount and/or Complexity of Data Reviewed Labs: ordered. Radiology: ordered.        Final diagnoses:  None    ED Discharge Orders     None          Suellen Sherran LABOR, PA-C 03/31/24 1517    Simon Lavonia SAILOR, MD 03/31/24 1555

## 2024-03-31 NOTE — ED Notes (Signed)
 Orthostatic blood pressure completed . Patient denies dizziness when standing over 3 minutes 217/159. Patient informed nurse his blood pressure is usually higher. Will notify MD

## 2024-03-31 NOTE — ED Triage Notes (Signed)
 Pt woke up with nasal congestion and became dizzy. Pt states he feels drunk. A&Ox4.    Pt only took his BP meds today

## 2024-03-31 NOTE — Discharge Instructions (Addendum)
 Pleasure in care of you today.  You are seen for feeling dizzy.  Your blood pressure is elevated.  Make sure you continue to take your medicines and follow-up closely with your primary care doctor for blood pressure recheck early next week.  Your workup today was reassuring.  You have normal CT scan of your brain, normal kidney function, normal blood counts and you were negative for COVID flu and RSV.  Your EKG was reassuring as well.  Drink plain fluids, rest and follow-up with PCP as directed, come back to the ER if you have new or worsening symptoms.

## 2024-04-03 ENCOUNTER — Other Ambulatory Visit: Payer: Self-pay | Admitting: Family Medicine

## 2024-04-03 DIAGNOSIS — I10 Essential (primary) hypertension: Secondary | ICD-10-CM

## 2024-04-03 MED ORDER — OLMESARTAN MEDOXOMIL-HCTZ 40-12.5 MG PO TABS: 1.0000 | ORAL_TABLET | Freq: Every day | ORAL | 1 refills | Status: DC

## 2024-04-03 NOTE — Telephone Encounter (Signed)
 Copied from CRM #[id number]. Topic: Clinical - Medication Question >> Apr 03, 2024  3:44 PM Donee H wrote: Reason for CRM: Patient is calling regarding medication olmesartan -hydrochlorothiazide (BENICAR  HCT) 40-12.5 MG tablet. Patient states medication was filled at [facility] previously and it was a different name brand which placed him ER. He will like to see if medication can be sent to [facility] pharmacy. He stated use to get medication there and he didn't have any issues with medication.     [facility] - [place], [place] - [address] [place], [address] [phone]

## 2024-05-08 ENCOUNTER — Other Ambulatory Visit: Payer: Self-pay | Admitting: Family Medicine

## 2024-05-08 DIAGNOSIS — I10 Essential (primary) hypertension: Secondary | ICD-10-CM

## 2024-05-08 MED ORDER — OLMESARTAN MEDOXOMIL-HCTZ 40-12.5 MG PO TABS: 1.0000 | ORAL_TABLET | Freq: Every day | ORAL | 1 refills | Status: DC

## 2024-05-08 NOTE — Telephone Encounter (Signed)
 Copied from CRM #[id number]. Topic: Clinical - Medication Refill >> May 08, 2024  9:34 AM Berwyn MATSU wrote: Medication: olmesartan -hydrochlorothiazide (BENICAR  HCT) 40-12.5 MG tablet   Has the patient contacted their pharmacy? Yes (Agent: If no, request that the patient contact the pharmacy for the refill. If patient does not wish to contact the pharmacy document the reason why and proceed with request.) (Agent: If yes, when and what did the pharmacy advise?)  This is the patient's preferred pharmacy:  [facility] [address] [place] - [address] [place] [address] Phone: [phone] Fax: [phone]  Is this the correct pharmacy for this prescription? Yes If no, delete pharmacy and type the correct one.   Has the prescription been filled recently? Yes  Is the patient out of the medication? No  Has the patient been seen for an appointment in the last year OR does the patient have an upcoming appointment? Yes  Can we respond through MyChart? No  Agent: Please be advised that Rx refills may take up to 3 business days. We ask that you follow-up with your pharmacy.   ----------------------------------------------------------------------- From previous Reason for Contact - Medication Question: Reason for CRM:

## 2024-05-11 ENCOUNTER — Telehealth: Payer: Self-pay | Admitting: Family Medicine

## 2024-05-11 NOTE — Telephone Encounter (Signed)
 Patient came by the office said he went to pick up the 90 day supply of the medicine and it was the wrong kind of medicine and sent him to the ER DUE to wrong brand.  So now he is trying to get olmesartan -hydrochlorothiazide (BENICAR  HCT) 40-12.5 MG tablet [[id number]]  But the pharmacy will not refill it due to has already refill the 90 day supply he picked up.  He is completely out this medicine he can not take the 90 day supply that he pick up

## 2024-05-11 NOTE — Telephone Encounter (Signed)
 Patient said he can not take it back to pharmacy because he threw it away. He said he is willing pay out of pocket.

## 2024-05-11 NOTE — Telephone Encounter (Signed)
 I spoke to [facility] and got this medication fixed, patient advised

## 2024-05-31 ENCOUNTER — Ambulatory Visit: Payer: Self-pay

## 2024-05-31 NOTE — Telephone Encounter (Signed)
 FYI Only or Action Required?: FYI only for provider: home care advice.  Patient was last seen in primary care on 01/07/2024 by Edman Meade PEDLAR, FNP.  Called Nurse Triage reporting Constipation.  Symptoms began 2 weeks.  Interventions attempted: Nothing.  Symptoms are: gradually improving.  Triage Disposition: Home Care  Patient/caregiver understands and will follow disposition?: Yes     Copied from CRM #[id number]. Topic: Clinical - Medication Question >> May 31, 2024  8:40 AM Myrick T wrote: Reason for CRM: patient said he has not had bowel movement and it has been more than 2 weeks. Patient is asking for a recommendation for something over the counter.   ----------------------------------------------------------------------- From previous Reason for Contact - Scheduling: Patient/patient representative is calling to schedule an appointment. Refer to attachments for appointment information. Reason for Disposition  Over-The-Counter (OTC) medicines for constipation, questions about  Answer Assessment - Initial Assessment Questions 1. STOOL PATTERN OR FREQUENCY: How often do you have a bowel movement (BM)?  (Normal range: 3 times a day to every 3 days)  When was your last BM?       Had bowel movement in hard pieces 2. STRAINING: Do you have to strain to have a BM?      yes 3. ONSET: When did the constipation begin?     2 weeks 4. RECTAL PAIN: Does your rectum hurt when the stool comes out? If Yes, ask: Do you have hemorrhoids? How bad is the pain?  (Scale 1-10; or mild, moderate, severe)     no 5. BM COMPOSITION: Are the stools hard?      yes 6. BLOOD ON STOOLS: Has there been any blood on the toilet tissue or on the surface of the BM? If Yes, ask: When was the last time?     no 7. CHRONIC CONSTIPATION: Is this a new problem for you?  If No, ask: How long have you had this problem? (days, weeks, months)      no 8. CHANGES IN DIET OR HYDRATION: Have  there been any recent changes in your diet? How much fluids are you drinking on a daily basis?  How much have you had to drink today?     No, and no hydration- does not like water 9. MEDICINES: Have you been taking any new medicines? Are you taking any narcotic pain medicines? (e.g., Dilaudid, morphine , Percocet, Vicodin)     na 10. LAXATIVES: Have you been using any stool softeners, laxatives, or enemas?  If Yes, ask What are you using, how often, and when was the last time?       Stool softeners 11. ACTIVITY:  How much walking do you do every day?  Has your activity level decreased in the past week?        na 12. CAUSE: What do you think is causing the constipation?        unsure 13. MEDICAL HISTORY: Do you have a history of hemorrhoids, rectal fissures, rectal surgery, or rectal abscess?         na 14. OTHER SYMPTOMS: Do you have any other symptoms? (e.g., abdomen pain, bloating, fever, vomiting)     bloating 15. PREGNANCY: Is there any chance you are pregnant? When was your last menstrual period?       Na  Pt stated had small hard balls of bowel movement last night after straining for a long time.  Education given by nurse on water, diet, exercise, stool softeners and laxatives.  Protocols used: Constipation-A-AH

## 2024-05-31 NOTE — Telephone Encounter (Signed)
 Duplicate. Please see previous triage note

## 2024-05-31 NOTE — Telephone Encounter (Signed)
 Noted home care advised

## 2024-06-29 ENCOUNTER — Encounter (HOSPITAL_COMMUNITY): Payer: Self-pay

## 2024-06-29 ENCOUNTER — Other Ambulatory Visit: Payer: Self-pay

## 2024-06-29 ENCOUNTER — Emergency Department (HOSPITAL_COMMUNITY): Admission: EM | Admit: 2024-06-29 | Discharge: 2024-06-30 | Disposition: A | Discharge status: Home / Self Care

## 2024-06-29 ENCOUNTER — Emergency Department (HOSPITAL_COMMUNITY)

## 2024-06-29 DIAGNOSIS — R600 Localized edema: Secondary | ICD-10-CM | POA: Insufficient documentation

## 2024-06-29 DIAGNOSIS — R2243 Localized swelling, mass and lump, lower limb, bilateral: Secondary | ICD-10-CM | POA: Diagnosis present

## 2024-06-29 LAB — BASIC METABOLIC PANEL WITH GFR
Anion gap: 5 (ref 5–15)
BUN: 11 mg/dL (ref 6–20)
CO2: 33 mmol/L — ABNORMAL HIGH (ref 22–32)
Calcium: 9 mg/dL (ref 8.9–10.3)
Chloride: 102 mmol/L (ref 98–111)
Creatinine, Ser: 0.79 mg/dL (ref 0.61–1.24)
GFR, Estimated: >60 mL/min
Glucose, Bld: 79 mg/dL (ref 70–99)
Potassium: 4.6 mmol/L (ref 3.5–5.1)
Sodium: 140 mmol/L (ref 135–145)

## 2024-06-29 LAB — CBC
HCT: 46.9 % (ref 39.0–52.0)
Hemoglobin: 15 g/dL (ref 13.0–17.0)
MCH: 28.5 pg (ref 26.0–34.0)
MCHC: 32 g/dL (ref 30.0–36.0)
MCV: 89 fL (ref 80.0–100.0)
Platelets: 247 K/uL (ref 150–400)
RBC: 5.27 MIL/uL (ref 4.22–5.81)
RDW: 14.1 % (ref 11.5–15.5)
WBC: 10.2 K/uL (ref 4.0–10.5)
nRBC: 0 % (ref 0.0–0.2)

## 2024-06-29 LAB — PRO BRAIN NATRIURETIC PEPTIDE: Pro Brain Natriuretic Peptide: <50 pg/mL

## 2024-06-29 MED ORDER — FUROSEMIDE 40 MG PO TABS
40.0000 mg | ORAL_TABLET | Freq: Once | ORAL | Status: AC
  Administered 2024-06-29: 40 mg via ORAL
  Filled 2024-06-29: qty 1

## 2024-06-29 MED ORDER — FUROSEMIDE 40 MG PO TABS: 40.0000 mg | ORAL_TABLET | Freq: Every day | ORAL | 0 refills | Status: DC

## 2024-06-29 NOTE — ED Notes (Signed)
 Blood labs drawn: blue, green x2, purple.

## 2024-06-29 NOTE — ED Triage Notes (Signed)
 Pt reports 2-3 days of BIL LE swelling. Edema noted up to knees. No SHOB reported. Pt takes hydrochlorothiazide.

## 2024-06-29 NOTE — Discharge Instructions (Signed)
 Continue the medicines you are already taking for high blood pressure.  Start taking your Lasix  every day.  You can wear compression stockings.  Is important that you call and follow-up with your primary care doctor in 1 to 2 weeks.  Return to the ER for any new or worsening symptoms.

## 2024-06-29 NOTE — ED Provider Notes (Signed)
 " [place] EMERGENCY DEPARTMENT AT [facility] Provider Note   CSN: [id number] Arrival date & time: 06/29/24  1713     Patient presents with: Leg Swelling   Travis Beltran is a 49 y.o. male.  {Add pertinent medical, surgical, social history, OB history to HPI:5937} 49 year old male presents for evaluation of leg swelling.  States has been going for 5 days.  He is on olmesartan  hydrochlorothiazide for high blood pressure.  States he has had no shortness of breath or difficulty walking.  Denies any other symptoms or concerns.  He is not been having any chest pain.  He has no history of CHF or cardiac history.        Prior to Admission medications  Medication Sig Start Date End Date Taking? Authorizing Provider  olmesartan -hydrochlorothiazide (BENICAR  HCT) 40-12.5 MG tablet Take 1 tablet by mouth daily. 05/08/24 11/04/24  Bacchus, Meade PEDLAR, FNP  pyridOXINE (VITAMIN B6) 50 MG tablet Take 1 tablet (50 mg total) by mouth daily. Patient not taking: Reported on 01/07/2024 06/29/23   Edman Meade PEDLAR, FNP  predniSONE  (STERAPRED UNI-PAK 21 TAB) 10 MG (21) TBPK tablet 10mg  Tabs, 6 day taper. Use as directed Patient not taking: Reported on 11/03/2021 12/14/20   Roselyn Carlin NOVAK, MD    Allergies: Amlodipine  besylate    Review of Systems  Constitutional:  Negative for chills and fever.  HENT:  Negative for ear pain and sore throat.   Eyes:  Negative for pain and visual disturbance.  Respiratory:  Negative for cough and shortness of breath.   Cardiovascular:  Positive for leg swelling. Negative for chest pain and palpitations.  Gastrointestinal:  Negative for abdominal pain and vomiting.  Genitourinary:  Negative for dysuria and hematuria.  Musculoskeletal:  Negative for arthralgias and back pain.  Skin:  Negative for color change and rash.  Neurological:  Negative for seizures and syncope.  All other systems reviewed and are negative.   Updated Vital Signs BP (!) 139/92    Pulse 72   Temp 98 F (36.7 C) (Oral)   Resp 19   Ht 6' 2 (1.88 m)   Wt (!) 184.2 kg   SpO2 93%   BMI 52.13 kg/m   Physical Exam Vitals and nursing note reviewed.  Constitutional:      General: He is not in acute distress.    Appearance: Normal appearance. He is well-developed. He is not ill-appearing.  HENT:     Head: Normocephalic and atraumatic.  Eyes:     Conjunctiva/sclera: Conjunctivae normal.  Cardiovascular:     Rate and Rhythm: Normal rate and regular rhythm.     Heart sounds: No murmur heard. Pulmonary:     Effort: Pulmonary effort is normal. No respiratory distress.     Breath sounds: Normal breath sounds.  Abdominal:     Palpations: Abdomen is soft.     Tenderness: There is no abdominal tenderness.  Musculoskeletal:        General: No swelling.     Cervical back: Neck supple.     Right lower leg: Edema present.     Left lower leg: Edema present.  Skin:    General: Skin is warm and dry.     Capillary Refill: Capillary refill takes less than 2 seconds.  Neurological:     General: No focal deficit present.     Mental Status: He is alert.  Psychiatric:        Mood and Affect: Mood normal.     (  all labs ordered are listed, but only abnormal results are displayed) Labs Reviewed  BASIC METABOLIC PANEL WITH GFR - Abnormal; Notable for the following components:      Result Value   CO2 33 (*)    All other components within normal limits  CBC  PRO BRAIN NATRIURETIC PEPTIDE    EKG: None  Radiology: No results found.  {Document cardiac monitor, telemetry assessment procedure when appropriate:32947} Procedures   Medications Ordered in the ED  furosemide  (LASIX ) tablet 40 mg (has no administration in time range)      {Click here for ABCD2, HEART and other calculators REFRESH Note before signing:1}                              Medical Decision Making Amount and/or Complexity of Data Reviewed Labs: ordered. Radiology: ordered.  Risk Prescription  drug management.   ***  {Document critical care time when appropriate  Document review of labs and clinical decision tools ie CHADS2VASC2, etc  Document your independent review of radiology images and any outside records  Document your discussion with family members, caretakers and with consultants  Document social determinants of health affecting pt's care  Document your decision making why or why not admission, treatments were needed:32947:::1}   Final diagnoses:  None    ED Discharge Orders     None        "

## 2024-07-10 ENCOUNTER — Ambulatory Visit: Payer: Self-pay

## 2024-07-10 NOTE — Telephone Encounter (Signed)
 ED or UC advised

## 2024-07-10 NOTE — Telephone Encounter (Signed)
 Please contact pt if there is any availability today, otherwise he was referred to seek care at [facility] or ED for his unchanging symptoms.   ED 1/8 was diagnosed with peripheral edema, sent home with lasix  which has been ineffective, notes swelling from waist down to ankles, everything is tight, it hurts to walk.   FYI Only or Action Required?: Action required by provider: request for appointment.  Patient was last seen in primary care on 01/07/2024 by Edman Meade PEDLAR, FNP.  Called Nurse Triage reporting Leg Swelling.  Symptoms began several weeks ago.  Interventions attempted: Prescription medications: lasix .  Symptoms are: unchanged.  Triage Disposition: See HCP Within 4 Hours (Or PCP Triage)  Patient/caregiver understands and will follow disposition?: Yes    Reason for Triage: Red Word that prompted transfer to Nurse Triage: swelling and retaining fluid from waist to ankles both sides    Reason for Disposition  SEVERE leg swelling (e.g., swelling extends above knee, entire leg is swollen, weeping fluid)  Answer Assessment - Initial Assessment Questions 1. ONSET: When did the swelling start? (e.g., minutes, hours, days)     January  2. LOCATION: What part of the leg is swollen?  Are both legs swollen or just one leg?     Swelling from my waist down to my ankles.  3. SEVERITY: How bad is the swelling? (e.g., localized; mild, moderate, severe)     The bottom of my thighs is real tight. Everything is tight.   4. REDNESS: Is there redness or signs of infection?     Yes, and reports they are hot to touch  5. PAIN: Is the swelling painful to touch? If Yes, ask: How painful is it?   (Scale 1-10; mild, moderate or severe)     An agonizing 6 or 7   6. FEVER: Do you have a fever? If Yes, ask: What is it, how was it measured, and when did it start?      Denies  7. CAUSE: What do you think is causing the leg swelling?     Pt unsure, was seen in the ED  for it 1/8 and was discharged without a diagnosis  8. MEDICAL HISTORY: Do you have a history of blood clots (e.g., DVT), cancer, heart failure, kidney disease, or liver failure?     Denies  9. RECURRENT SYMPTOM: Have you had leg swelling before? If Yes, ask: When was the last time? What happened that time?     This has happened in the past when he missed BP medication, but not this bad   10. OTHER SYMPTOMS: Do you have any other symptoms? (e.g., chest pain, difficulty breathing)       Denies  Pt advised to elevate legs above the level of his heart, to wear compression socks, move and exercise gently, reduce salt intake, avoid prolonged sitting, and attempt massage. Pt agreeable.  Protocols used: Leg Swelling and Edema-A-AH

## 2024-07-11 ENCOUNTER — Ambulatory Visit: Payer: Self-pay | Admitting: Family Medicine

## 2024-07-13 ENCOUNTER — Telehealth: Payer: Self-pay | Admitting: Family Medicine

## 2024-07-13 ENCOUNTER — Encounter: Payer: Self-pay | Admitting: Family Medicine

## 2024-07-13 DIAGNOSIS — I517 Cardiomegaly: Secondary | ICD-10-CM | POA: Diagnosis not present

## 2024-07-13 DIAGNOSIS — E876 Hypokalemia: Secondary | ICD-10-CM

## 2024-07-13 DIAGNOSIS — R0989 Other specified symptoms and signs involving the circulatory and respiratory systems: Secondary | ICD-10-CM | POA: Diagnosis not present

## 2024-07-13 DIAGNOSIS — I10 Essential (primary) hypertension: Secondary | ICD-10-CM

## 2024-07-13 MED ORDER — POTASSIUM CHLORIDE CRYS ER 10 MEQ PO TBCR: 10.0000 meq | EXTENDED_RELEASE_TABLET | Freq: Every day | ORAL | 1 refills | Status: AC

## 2024-07-13 NOTE — Assessment & Plan Note (Signed)
 Referral placed to Cardiology for further evaluation, collaborative care, and consideration of an echocardiogram. Encouraged the patient to take blood pressure medications as prescribed. Advised to decrease intake of high-sodium foods and to engage in increased physical activity as tolerated. Instructed to follow up promptly for worsening symptoms related to cardiomegaly and pulmonary vascular congestion, including: Shortness of breath or worsening dyspnea Orthopnea or paroxysmal nocturnal dyspnea Rapid or unexplained weight gain Worsening lower extremity edema Chest pain, palpitations, or dizziness

## 2024-07-13 NOTE — Progress Notes (Signed)
 "  Virtual Visit via Video Note  I connected with Travis Beltran on 07/13/24 at  3:00 PM EST by a video enabled telemedicine application and verified that I am speaking with the correct person using two identifiers.  Patient Location: Home Provider Location: Home Office  I discussed the limitations, risks, security, and privacy concerns of performing an evaluation and management service by video and the availability of in person appointments. I also discussed with the patient that there may be a patient responsible charge related to this service. The patient expressed understanding and agreed to proceed.  Subjective: PCP: Edman Meade PEDLAR, FNP  Chief Complaint  Patient presents with   Follow-up    Follow up   Leg Swelling    Fluid in legs , went to [facility] , was given lasix . Had additional testing done in [place] for the same reason. Dr. Madison weak heart , [place] cardi[place] not been able to reach them.    HPI The patient, with no prior history of congestive heart failure or known cardiac disease, was recently evaluated in the emergency department on 06/29/2024 for peripheral edema. At that time, a basic metabolic panel (BMP) was unremarkable; however, chest X-ray demonstrated pulmonary vascular congestion and cardiomegaly. EKG showed sinus rhythm without ectopy.  The patient was discharged home on Lasix  (furosemide ) 40 mg daily. At todays visit, he denies shortness of breath, chest pain, and orthopnea. He reports persistent bilateral lower extremity swelling, extending from the ankles to the knees, and endorses compliance with his current treatment regimen.  He states that a cardiology referral was placed in [place], but he has not yet heard back regarding the appointment.   ROS: Per HPI Current Medications[1]  Observations/Objective: There were no vitals filed for this visit. Physical Exam Patient is well-developed, well-nourished in no acute  distress.  Head is normocephalic, atraumatic.  No labored breathing.  Speech is clear and coherent with logical content.  Patient is alert and oriented at baseline.   Assessment and Plan: Essential hypertension Assessment & Plan: Crolled hypertension as reported by the patient The patient is currently asymptomatic. He reports non-adherence to his blood pressure medication, stating he skips doses or forgets to take it. Currently prescribed Olmesartan -Hydrochlorothiazide 40/12.5 mg daily. Discussed the cardiovascular risks of uncontrolled hypertension, including heart attack, stroke, kidney damage, and heart failure. Emphasized the importance of daily medication adherence and compliance. No changes made to the current medication regimen at this time. Encouraged lifestyle modifications, including a low-sodium diet and increased physical activity. Patient to monitor blood pressure at home and follow up as scheduled BP Readings from Last 3 Encounters:  06/30/24 138/85  03/31/24 (!) 160/110  01/07/24 (!) 160/100       Cardiomegaly Assessment & Plan: Referral placed to Cardiology for further evaluation, collaborative care, and consideration of an echocardiogram. Encouraged the patient to take blood pressure medications as prescribed. Advised to decrease intake of high-sodium foods and to engage in increased physical activity as tolerated. Instructed to follow up promptly for worsening symptoms related to cardiomegaly and pulmonary vascular congestion, including: Shortness of breath or worsening dyspnea Orthopnea or paroxysmal nocturnal dyspnea Rapid or unexplained weight gain Worsening lower extremity edema Chest pain, palpitations, or dizziness   Orders: -     Ambulatory referral to Cardiology -     [facility]  Pulmonary vascular congestion -     Ambulatory referral to Cardiology -     BMP8+EGFR  Potassium deficiency -  Potassium Chloride  [facility]; Take 1 tablet (10 mEq  total) by mouth daily.  Dispense: 30 tablet; Refill: 1    Follow Up Instructions: No follow-ups on file.   I discussed the assessment and treatment plan with the patient. The patient was provided an opportunity to ask questions, and all were answered. The patient agreed with the plan and demonstrated an understanding of the instructions.   The patient was advised to call back or seek an in-person evaluation if the symptoms worsen or if the condition fails to improve as anticipated.  The above assessment and management plan was discussed with the patient. The patient verbalized understanding of and has agreed to the management plan.   Meade JENEANE Gerlach, FNP     [1]  Current Outpatient Medications:    furosemide  (LASIX ) 40 MG tablet, Take 1 tablet (40 mg total) by mouth daily., Disp: 30 tablet, Rfl: 0   olmesartan -hydrochlorothiazide (BENICAR  HCT) 40-12.5 MG tablet, Take 1 tablet by mouth daily., Disp: 90 tablet, Rfl: 1   potassium chloride  (KLOR-CON  M) 10 MEQ tablet, Take 1 tablet (10 mEq total) by mouth daily., Disp: 30 tablet, Rfl: 1   pyridOXINE (VITAMIN B6) 50 MG tablet, Take 1 tablet (50 mg total) by mouth daily., Disp: 30 tablet, Rfl: 2  "

## 2024-07-13 NOTE — Assessment & Plan Note (Signed)
 Crolled hypertension as reported by the patient The patient is currently asymptomatic. He reports non-adherence to his blood pressure medication, stating he skips doses or forgets to take it. Currently prescribed Olmesartan -Hydrochlorothiazide 40/12.5 mg daily. Discussed the cardiovascular risks of uncontrolled hypertension, including heart attack, stroke, kidney damage, and heart failure. Emphasized the importance of daily medication adherence and compliance. No changes made to the current medication regimen at this time. Encouraged lifestyle modifications, including a low-sodium diet and increased physical activity. Patient to monitor blood pressure at home and follow up as scheduled BP Readings from Last 3 Encounters:  06/30/24 138/85  03/31/24 (!) 160/110  01/07/24 (!) 160/100

## 2024-07-15 ENCOUNTER — Other Ambulatory Visit: Payer: Self-pay

## 2024-07-15 ENCOUNTER — Emergency Department (HOSPITAL_COMMUNITY)
Admission: EM | Admit: 2024-07-15 | Discharge: 2024-07-15 | Disposition: A | Discharge status: Home / Self Care | Attending: Emergency Medicine | Admitting: Emergency Medicine

## 2024-07-15 ENCOUNTER — Encounter (HOSPITAL_COMMUNITY): Payer: Self-pay | Admitting: Emergency Medicine

## 2024-07-15 ENCOUNTER — Emergency Department (HOSPITAL_COMMUNITY)

## 2024-07-15 DIAGNOSIS — I10 Essential (primary) hypertension: Secondary | ICD-10-CM | POA: Diagnosis not present

## 2024-07-15 DIAGNOSIS — J40 Bronchitis, not specified as acute or chronic: Secondary | ICD-10-CM | POA: Diagnosis not present

## 2024-07-15 DIAGNOSIS — Z79899 Other long term (current) drug therapy: Secondary | ICD-10-CM | POA: Diagnosis not present

## 2024-07-15 DIAGNOSIS — R600 Localized edema: Secondary | ICD-10-CM | POA: Diagnosis not present

## 2024-07-15 DIAGNOSIS — R0602 Shortness of breath: Secondary | ICD-10-CM | POA: Diagnosis present

## 2024-07-15 LAB — HEPATIC FUNCTION PANEL
ALT: 12 U/L (ref 0–44)
AST: 20 U/L (ref 15–41)
Albumin: 4 g/dL (ref 3.5–5.0)
Alkaline Phosphatase: 94 U/L (ref 38–126)
Bilirubin, Direct: 0.2 mg/dL (ref 0.0–0.2)
Indirect Bilirubin: 0.3 mg/dL (ref 0.3–0.9)
Total Bilirubin: 0.5 mg/dL (ref 0.0–1.2)
Total Protein: 6.9 g/dL (ref 6.5–8.1)

## 2024-07-15 LAB — BASIC METABOLIC PANEL WITH GFR
Anion gap: 9 (ref 5–15)
BUN: 10 mg/dL (ref 6–20)
CO2: 29 mmol/L (ref 22–32)
Calcium: 9.1 mg/dL (ref 8.9–10.3)
Chloride: 103 mmol/L (ref 98–111)
Creatinine, Ser: 0.97 mg/dL (ref 0.61–1.24)
GFR, Estimated: >60 mL/min
Glucose, Bld: 99 mg/dL (ref 70–99)
Potassium: 4.2 mmol/L (ref 3.5–5.1)
Sodium: 140 mmol/L (ref 135–145)

## 2024-07-15 LAB — CBC
HCT: 47 % (ref 39.0–52.0)
Hemoglobin: 15.2 g/dL (ref 13.0–17.0)
MCH: 28.2 pg (ref 26.0–34.0)
MCHC: 32.3 g/dL (ref 30.0–36.0)
MCV: 87.2 fL (ref 80.0–100.0)
Platelets: 266 10*3/uL (ref 150–400)
RBC: 5.39 MIL/uL (ref 4.22–5.81)
RDW: 13.9 % (ref 11.5–15.5)
WBC: 7.8 10*3/uL (ref 4.0–10.5)
nRBC: 0 % (ref 0.0–0.2)

## 2024-07-15 LAB — TROPONIN T, HIGH SENSITIVITY
Troponin T High Sensitivity: 12 ng/L (ref 0–19)
Troponin T High Sensitivity: 12 ng/L (ref 0–19)

## 2024-07-15 LAB — PRO BRAIN NATRIURETIC PEPTIDE: Pro Brain Natriuretic Peptide: <50 pg/mL

## 2024-07-15 MED ORDER — IOHEXOL 350 MG/ML SOLN
75.0000 mL | Freq: Once | INTRAVENOUS | Status: AC | PRN
  Administered 2024-07-15: 75 mL via INTRAVENOUS

## 2024-07-15 NOTE — ED Notes (Signed)
 CCMD called nurse to start monitoring. Monitoring set up.

## 2024-07-15 NOTE — Discharge Instructions (Signed)
 Increase your Lasix  so you are taking 80 mg a day for the next 3 days.  Keep your legs elevated above your heart is much as possible.  Follow-up with your family doctor next week for recheck

## 2024-07-15 NOTE — ED Notes (Signed)
 Portable xray at bedside.

## 2024-07-15 NOTE — ED Triage Notes (Signed)
 Pt here with c/o SOB with exertion, upper back pain, swelling in bilateral lower extremities, and L sided chest pain.

## 2024-07-15 NOTE — ED Provider Notes (Signed)
 "  EMERGENCY DEPARTMENT AT [facility] Provider Note   CSN: [id number] Arrival date & time: 07/15/24  0601     Patient presents with: Shortness of Breath and Chest Pain   Travis Beltran is a 49 y.o. male.   Patient complains of shortness of breath.  Patient has a history of hypertension and GERD.  Patient also complains of swelling in his legs  The history is provided by the patient and medical records. No language interpreter was used.  Shortness of Breath Severity:  Moderate Onset quality:  Sudden Timing:  Intermittent Progression:  Waxing and waning Chronicity:  New Context: activity   Relieved by:  Nothing Worsened by:  Nothing Associated symptoms: no abdominal pain, no cough, no headaches and no rash   Chest Pain Associated symptoms: shortness of breath   Associated symptoms: no abdominal pain, no back pain, no cough, no fatigue and no headache        Prior to Admission medications  Medication Sig Start Date End Date Taking? Authorizing Provider  furosemide  (LASIX ) 40 MG tablet Take 1 tablet (40 mg total) by mouth daily. 06/29/24   Kammerer, Megan L, DO  olmesartan -hydrochlorothiazide (BENICAR  HCT) 40-12.5 MG tablet Take 1 tablet by mouth daily. 05/08/24 11/04/24  Bacchus, Gloria Z, FNP  potassium chloride  (KLOR-CON  M) 10 MEQ tablet Take 1 tablet (10 mEq total) by mouth daily. 07/13/24   Bacchus, Meade PEDLAR, FNP  pyridOXINE (VITAMIN B6) 50 MG tablet Take 1 tablet (50 mg total) by mouth daily. 06/29/23   Bacchus, Meade PEDLAR, FNP  predniSONE  (STERAPRED UNI-PAK 21 TAB) 10 MG (21) TBPK tablet 10mg  Tabs, 6 day taper. Use as directed Patient not taking: Reported on 11/03/2021 12/14/20   Roselyn Carlin NOVAK, MD    Allergies: Amlodipine  besylate    Review of Systems  Constitutional:  Negative for appetite change and fatigue.  HENT:  Negative for congestion, ear discharge and sinus pressure.   Eyes:  Negative for discharge.  Respiratory:  Positive for shortness of  breath. Negative for cough.   Gastrointestinal:  Negative for abdominal pain and diarrhea.  Genitourinary:  Negative for frequency and hematuria.  Musculoskeletal:  Negative for back pain.       Edema in legs  Skin:  Negative for rash.  Neurological:  Negative for seizures and headaches.  Psychiatric/Behavioral:  Negative for hallucinations.     Updated Vital Signs BP 139/87   Pulse 74   Temp 97.7 F (36.5 C) (Oral)   Resp 10   Ht 6' 2 (1.88 m)   Wt (!) 184 kg   SpO2 96%   BMI 52.08 kg/m   Physical Exam Vitals and nursing note reviewed.  Constitutional:      Appearance: He is well-developed.  HENT:     Head: Normocephalic.     Nose: Nose normal.  Eyes:     General: No scleral icterus.    Conjunctiva/sclera: Conjunctivae normal.  Neck:     Thyroid: No thyromegaly.  Cardiovascular:     Rate and Rhythm: Normal rate and regular rhythm.     Heart sounds: No murmur heard.    No friction rub. No gallop.  Pulmonary:     Breath sounds: No stridor. No wheezing or rales.  Chest:     Chest wall: No tenderness.  Abdominal:     General: There is no distension.     Tenderness: There is no abdominal tenderness. There is no rebound.  Musculoskeletal:  General: Normal range of motion.     Cervical back: Neck supple.     Comments: 2+ edema in both legs up to his knees  Lymphadenopathy:     Cervical: No cervical adenopathy.  Skin:    Findings: No erythema or rash.  Neurological:     Mental Status: He is alert and oriented to person, place, and time.     Motor: No abnormal muscle tone.     Coordination: Coordination normal.  Psychiatric:        Behavior: Behavior normal.     (all labs ordered are listed, but only abnormal results are displayed) Labs Reviewed  BASIC METABOLIC PANEL WITH GFR  CBC  PRO BRAIN NATRIURETIC PEPTIDE  HEPATIC FUNCTION PANEL  TROPONIN T, HIGH SENSITIVITY  TROPONIN T, HIGH SENSITIVITY    EKG: EKG Interpretation Date/Time:  Saturday  July 15 2024 06:23:11 EST Ventricular Rate:  74 PR Interval:    QRS Duration:  109 QT Interval:  387 QTC Calculation: 430 R Axis:   16  Text Interpretation: Sinus rhythm Low voltage, precordial leads Non-specific intra-ventricular conduction delay No significant change since last tracing Confirmed by Roselyn Dunnings [phone]) on 07/15/2024 6:26:58 AM  Radiology: CT Angio Chest PE W and/or Wo Contrast Result Date: 07/15/2024 CLINICAL DATA:  Shortness of breath with left-sided chest pain. Clinical concern for pulmonary embolus. EXAM: CT ANGIOGRAPHY CHEST WITH CONTRAST TECHNIQUE: Multidetector CT imaging of the chest was performed using the standard protocol during bolus administration of intravenous contrast. Multiplanar CT image reconstructions and MIPs were obtained to evaluate the vascular anatomy. RADIATION DOSE REDUCTION: This exam was performed according to the departmental dose-optimization program which includes automated exposure control, adjustment of the mA and/or kV according to patient size and/or use of iterative reconstruction technique. CONTRAST:  75mL OMNIPAQUE  IOHEXOL  350 MG/ML SOLN COMPARISON:  06/29/2020 FINDINGS: Cardiovascular: The heart size is upper normal to borderline enlarged. No substantial pericardial effusion. No thoracic aortic aneurysm. No substantial atherosclerotic calcification of the thoracic aorta. Assessment of distal pulmonary arteries to the lower lobes degraded by bolus timing and respiratory motion. Within this limitation there is no evidence for acute pulmonary embolus. Mediastinum/Nodes: No mediastinal lymphadenopathy. There is no hilar lymphadenopathy. The esophagus has normal imaging features. There is no axillary lymphadenopathy. Lungs/Pleura: Mild circumferential bronchial wall thickening noted in the lower lungs bilaterally with scattered lower lung predominant atelectasis. 5 mm posterior left upper lobe perifissural nodule identified on image 54. No  pulmonary edema or pleural effusion. Upper Abdomen: Visualized portion of the upper abdomen shows no acute findings. Musculoskeletal: No worrisome lytic or sclerotic osseous abnormality. Bilateral gynecomastia evident. Review of the MIP images confirms the above findings. IMPRESSION: 1. Assessment of distal pulmonary arteries to the lower lobes degraded by bolus timing and respiratory motion. Within this limitation there is no evidence for acute pulmonary embolus. 2. Mild circumferential bronchial wall thickening in the lower lungs bilaterally with scattered lower lung predominant atelectasis. Imaging features suggest sequelae of bronchitis. 3. 5 mm posterior left upper lobe perifissural nodule. Likely subpleural lymph node. No follow-up needed if patient is low-risk.This recommendation follows the consensus statement: Guidelines for Management of Incidental Pulmonary Nodules Detected on CT Images: From the Fleischner Society 2017; Radiology 2017; 284:228-243. Electronically Signed   By: Camellia Candle M.D.   On: 07/15/2024 08:45   DG Chest Portable 1 View Result Date: 07/15/2024 CLINICAL DATA:  Shortness of breath. EXAM: PORTABLE CHEST 1 VIEW COMPARISON:  06/29/2024 FINDINGS: The cardio pericardial silhouette is enlarged.  Right lung clear. Left base collapse/consolidation evident. No acute bony abnormality. Telemetry leads overlie the chest. IMPRESSION: Left base collapse/consolidation. Electronically Signed   By: Camellia Candle M.D.   On: 07/15/2024 06:59     Procedures   Medications Ordered in the ED  iohexol  (OMNIPAQUE ) 350 MG/ML injection 75 mL (75 mLs Intravenous Contrast Given 07/15/24 0808)                                    Medical Decision Making Amount and/or Complexity of Data Reviewed Labs: ordered. Radiology: ordered.  Risk Prescription drug management.   Peripheral edema and bronchitis.  Patient is 12 and overall up on his Lasix  follow-up with his PCP     Final diagnoses:   Bronchitis  Peripheral edema    ED Discharge Orders     None          Suzette Pac, MD 07/15/24 1933  "

## 2024-07-21 ENCOUNTER — Ambulatory Visit: Attending: Internal Medicine | Admitting: Internal Medicine

## 2024-07-21 ENCOUNTER — Encounter: Payer: Self-pay | Admitting: Internal Medicine

## 2024-07-21 VITALS — BP 142/93 | HR 80 | Ht 74.0 in | Wt >= 6400 oz

## 2024-07-21 DIAGNOSIS — Z720 Tobacco use: Secondary | ICD-10-CM | POA: Insufficient documentation

## 2024-07-21 DIAGNOSIS — R29818 Other symptoms and signs involving the nervous system: Secondary | ICD-10-CM | POA: Insufficient documentation

## 2024-07-21 DIAGNOSIS — R0602 Shortness of breath: Secondary | ICD-10-CM | POA: Insufficient documentation

## 2024-07-21 DIAGNOSIS — I5033 Acute on chronic diastolic (congestive) heart failure: Secondary | ICD-10-CM | POA: Diagnosis present

## 2024-07-21 MED ORDER — DAPAGLIFLOZIN PROPANEDIOL 10 MG PO TABS: 10.0000 mg | ORAL_TABLET | Freq: Every day | ORAL | 11 refills | Status: AC

## 2024-07-21 MED ORDER — AMLODIPINE BESYLATE 10 MG PO TABS: 10.0000 mg | ORAL_TABLET | Freq: Every day | ORAL | 3 refills | Status: DC

## 2024-07-21 MED ORDER — CARVEDILOL 6.25 MG PO TABS: 6.2500 mg | ORAL_TABLET | Freq: Two times a day (BID) | ORAL | 3 refills | Status: AC

## 2024-07-21 MED ORDER — TORSEMIDE 20 MG PO TABS: 40.0000 mg | ORAL_TABLET | Freq: Every day | ORAL | 3 refills | Status: AC

## 2024-07-21 NOTE — Progress Notes (Signed)
 "   Cardiology Office Note  Date: 07/21/2024   ID: Travis Beltran, DOB March 29, 1976, MRN [id number]  PCP:  Edman Meade PEDLAR, FNP  Cardiologist:  Diannah SHAUNNA Maywood, MD Electrophysiologist:  None   History of Present Illness: Travis Beltran is a 49 y.o. male known to have HTN was referred to cardiology clinic for evaluation of congestive heart failure.  Patient had ER visit couple weeks ago for SOB.  Chest x-ray showed pulmonary vascular congestion and cardiomegaly.  He was started on p.o. Lasix  40 mg once daily after which he noted significant improvement in his SOB symptoms.  Currently on p.o. Lasix  40 mg once daily.  He also was diagnosed with HTN in his 30s.  Does not check blood pressures at home.  Usually, it runs around 200 or more than 200 mmHg SBP.  On p.o. Lasix  40 mg once daily, olmesartan -HCTZ 40-12.5 mg once daily at home.  He also reported having chest pains at rest mostly.  SOB is improving now.  He underwent LHC in 2022 that showed angiographically normal coronaries, hypertensive coronaries noted.  LVEDP was normal.  Does not have any other symptoms of dizziness, syncope, palpitations.  Past Medical History:  Diagnosis Date   Heart murmur    Hypertension     Past Surgical History:  Procedure Laterality Date   LEFT HEART CATH AND CORONARY ANGIOGRAPHY N/A 07/02/2020   Procedure: LEFT HEART CATH AND CORONARY ANGIOGRAPHY;  Surgeon: Jordan, Peter M, MD;  Location: [facility] INVASIVE CV LAB;  Service: Cardiovascular;  Laterality: N/A;    Current Outpatient Medications  Medication Sig Dispense Refill   furosemide  (LASIX ) 40 MG tablet Take 1 tablet (40 mg total) by mouth daily. 30 tablet 0   olmesartan -hydrochlorothiazide (BENICAR  HCT) 40-12.5 MG tablet Take 1 tablet by mouth daily. 90 tablet 1   potassium chloride  (KLOR-CON  M) 10 MEQ tablet Take 1 tablet (10 mEq total) by mouth daily. (Patient not taking: Reported on 07/21/2024) 30 tablet 1   No current facility-administered  medications for this visit.   Allergies:  Amlodipine  besylate   Social History: The patient  reports that he has been smoking cigarettes. He has never used smokeless tobacco. He reports current alcohol use. He reports that he does not use drugs.   Family History: The patient's family history includes Cancer in his father; Hypertension in his father and mother.   ROS:  Please see the history of present illness. Otherwise, complete review of systems is positive for none.  All other systems are reviewed and negative.   Physical Exam: VS:  BP (!) 142/93 (BP Location: Left Arm, Cuff Size: Large)   Pulse 80   Ht 6' 2 (1.88 m)   Wt (!) 401 lb (181.9 kg)   SpO2 96%   BMI 51.49 kg/m , BMI Body mass index is 51.49 kg/m.  Wt Readings from Last 3 Encounters:  07/21/24 (!) 401 lb (181.9 kg)  07/15/24 (!) 405 lb 10.3 oz (184 kg)  06/29/24 (!) 406 lb (184.2 kg)    General: Patient appears comfortable at rest. HEENT: Conjunctiva and lids normal, oropharynx clear with moist mucosa. Neck: Supple, no elevated JVP or carotid bruits, no thyromegaly. Lungs: Clear to auscultation, nonlabored breathing at rest. Cardiac: Regular rate and rhythm, no S3 or significant systolic murmur, no pericardial rub. Abdomen: Soft, nontender, no hepatomegaly, bowel sounds present, no guarding or rebound. Extremities: 3+ pitting edema, distal pulses 2+. Skin: Warm and dry. Musculoskeletal: No kyphosis. Neuropsychiatric: Alert and oriented x3,  affect grossly appropriate.  Recent Labwork: 01/07/2024: TSH 1.210 07/15/2024: ALT 12; AST 20; BUN 10; Creatinine, Ser 0.97; Hemoglobin 15.2; Platelets 266; Potassium 4.2; Pro Brain Natriuretic Peptide <50.0; Sodium 140     Component Value Date/Time   CHOL 146 01/07/2024 0801   TRIG 69 01/07/2024 0801   HDL 46 01/07/2024 0801   CHOLHDL 3.2 01/07/2024 0801   CHOLHDL 4.3 06/29/2020 0707   VLDL 16 06/29/2020 0707   LDLCALC 86 01/07/2024 0801     Assessment and  Plan:  Acute on chronic diastolic heart failure - Patient had ER visit couple of weeks ago with DOE and imaging evidence of pulmonary vascular congestion/cardiomegaly.  His DOE improved after starting p.o. Lasix  40 mg once daily.  He still continues to have bilateral pitting edema and weights have gone up since July 2025. - Switch p.o. Lasix  to p.o. torsemide  40 mg once daily. - Start Farxiga  10 mg once daily.  Obtain BMP in 5 days. - Aggressive control of HTN recommended. - Obtain echocardiogram.  Prior echo from 2022 normal.  HTN, poorly controlled - Home blood pressures range around 200 mmHg SBP, sometimes more. - Switch p.o. Lasix  to p.o. torsemide  40 mg once daily, start Farxiga  10 mg once daily, start carvedilol  6.25 mg twice daily.  He is allergic to amlodipine .  Discontinue HCTZ. - Does not check blood pressures at home.  Strongly encouraged to check blood pressures at home.  Twice daily.  Will prescribe blood pressure machine. - Evaluate for OSA, obtain split-night sleep study.  Nicotine abuse - Currently smokes cigarettes.  Counseling provided.  40 minutes spent in reviewing prior medical records, reports, more than 3 labs, discussion and documentation.  Medication Adjustments/Labs and Tests Ordered: Current medicines are reviewed at length with the patient today.  Concerns regarding medicines are outlined above.    Disposition:  Follow up 3 months  Signed, Makaleigh Reinard Arleta Maywood, MD, 07/21/2024 1:40 PM    [facility] [address] [place], [place] [address]  "

## 2024-07-21 NOTE — Patient Instructions (Addendum)
 Medication Instructions:  Your physician has recommended you make the following change in your medication:   -Start Farxiga  10 mg once daiy -Start Amlodipine  10 mg once daily -Start Torsemide  40 mg once daily  -Discontinue Lasix  -Discontinue HCTZ  *If you need a refill on your cardiac medications before your next appointment, please call your pharmacy*  Lab Work: None If you have labs (blood work) drawn today and your tests are completely normal, you will receive your results only by: MyChart Message (if you have MyChart) OR A paper copy in the mail If you have any lab test that is abnormal or we need to change your treatment, we will call you to review the results.  Testing/Procedures: Your physician has requested that you have an echocardiogram. Echocardiography is a painless test that uses sound waves to create images of your heart. It provides your doctor with information about the size and shape of your heart and how well your hearts chambers and valves are working. This procedure takes approximately one hour. There are no restrictions for this procedure. Please do NOT wear cologne, perfume, aftershave, or lotions (deodorant is allowed). Please arrive 15 minutes prior to your appointment time.  Please note: We ask at that you not bring children with you during ultrasound (echo/ vascular) testing. Due to room size and safety concerns, children are not allowed in the ultrasound rooms during exams. Our front office staff cannot provide observation of children in our lobby area while testing is being conducted. An adult accompanying a patient to their appointment will only be allowed in the ultrasound room at the discretion of the ultrasound technician under special circumstances. We apologize for any inconvenience.   Split Night Sleep Study   Follow-Up: At [facility], you and your health needs are our priority.  As part of our continuing mission to provide you with  exceptional heart care, our providers are all part of one team.  This team includes your primary Cardiologist (physician) and Advanced Practice Providers or APPs (Physician Assistants and Nurse Practitioners) who all work together to provide you with the care you need, when you need it.  Your next appointment:   3 month(s)  Provider:   You may see Vishnu P Mallipeddi, MD or one of the following Advanced Practice Providers on your designated Care Team:   Brittany Strader, PA-C  Scotesia Bird[place], [place] Olivia Pavy, [place]     We recommend signing up for the patient portal called MyChart.  Sign up information is provided on this After Visit Summary.  MyChart is used to connect with patients for Virtual Visits (Telemedicine).  Patients are able to view lab/test results, encounter notes, upcoming appointments, etc.  Non-urgent messages can be sent to your provider as well.   To learn more about what you can do with MyChart, go to [email or web address].   Other Instructions Thank you for choosing [facility]!   Sleep Study in Adults: What to Expect A sleep study (polysomnogram) is a series of tests done while you are sleeping. It is used to see how well you sleep, help diagnose a sleep disorder, or create a plan to treat your sleep disorder. Sleep studies are done at sleep centers, which may be inside a hospital, office, or clinic. Your health care provider may recommend a sleep study if you: Have brief periods in which you stop breathing during sleep (sleepapnea). Fall asleep suddenly during the day (narcolepsy). Have trouble falling asleep or staying asleep (insomnia). Feel like you  need to move your legs when trying to fall asleep (restless legs syndrome). Move your legs by flexing and extending them regularly while asleep (periodic limb movement disorder). Act out your dreams while you sleep (sleep behavior disorder). Feel like you cannot move when you first wake up (sleep  paralysis). What tests are part of a sleep study? Most sleep studies record the following during sleep: Brain activity. Eye and limb movements. Heart rate and rhythm and blood pressure. Breathing rate and rhythm and blood oxygen level. Chest and belly movement as you breathe. Snoring or other noises. Body position. Tell a health care provider about: Any allergies you have, especially to products with sticky surfaces (adhesives). All medicines you are taking, including vitamins, herbs, eye drops, creams, and over-the-counter medicines. Any medical conditions you have. What happens before the test? Your health care provider will let you know if you should stop taking any of your regular medicines before the test. Bring your pajamas and toothbrush with you to the sleep study. Do not have caffeine on the day of your sleep study. Do not drink alcohol on the day of your sleep study. What happens during the test?     Most sleep studies are done during a normal period of time for a full night of sleep. You will arrive at the study center in the evening and go home in the morning. The room where you have the study may look like a hospital room or a hotel room. For the test: Round, sticky patches with sensors attached to recording wires (electrodes) will be placed on your scalp, face, chest, and limbs. Wires from all the electrodes and sensors will run from your bed to a computer. The wires can be taken off and put back on if you need to get out of bed to go to the bathroom. A sensor will be placed over your nose to measure airflow. A finger clip will be put on your finger or ear to measure your blood oxygen level (pulse oximetry). Belts will be placed around your belly and chest to measure breathing movements. Monitoring will begin. The health care providers doing the study may come in and out of the room during the study. Most of the time, they will be in another room monitoring your test as you  sleep. If you have signs of sleep apnea during your test, you may get a treatment mask to wear for the second half of the night. The mask provides positive airway pressure (PAP) to help you breathe better during sleep. This may greatly improve your sleep apnea. You will then have all tests done again with the mask in place to see if your measurements and recordings change. What can I expect after the test? A health care provider who specializes in sleep will evaluate the results of your sleep study and share them with you and your primary health care provider. Based on your results, your medical history, and a physical exam, you may be diagnosed with a sleep disorder. Your health care team will help determine your treatment options based on your diagnosis. This may include: Improving your sleep habits (sleep hygiene). Wearing a continuous positive airway pressure (CPAP) or bi-level positive airway pressure (BIPAP) mask. Wearing an oral device at night to improve breathing and reduce snoring. Taking medicines. Follow these instructions at home: Take over-the-counter and prescription medicines only as told by your health care provider. Make any lifestyle changes that your health care provider recommends. If you are instructed  to use a CPAP or BIPAP mask, make sure you use it nightly as directed. If you were given a device to open your airway while you sleep, use it only as told by your health care provider. Do not use any products that contain nicotine or tobacco. These products include cigarettes, chewing tobacco, and vaping devices, such as e-cigarettes. If you need help quitting, ask your health care provider. Summary A sleep study is a series of tests done while you are sleeping. It shows how well you sleep. Most sleep studies are done over one full night of sleep. You will arrive at the study center in the evening and go home in the morning. If you have signs of the sleep disorder called  sleep apnea during your test, you may get a treatment mask to wear for the second half of the night. A health care provider who specializes in sleep will evaluate the results of your sleep study and share them with your primary health care provider. This information is not intended to replace advice given to you by your health care provider. Make sure you discuss any questions you have with your health care provider. Document Revised: 04/20/2024 Document Reviewed: 10/08/2021 Elsevier Patient Education  2025 [employer].

## 2024-07-24 ENCOUNTER — Inpatient Hospital Stay (HOSPITAL_COMMUNITY): Admission: RE | Admit: 2024-07-24

## 2024-10-23 ENCOUNTER — Ambulatory Visit: Admitting: Internal Medicine

## 2024-12-06 ENCOUNTER — Ambulatory Visit: Payer: Self-pay | Admitting: Nurse Practitioner
